# Patient Record
Sex: Female | Born: 1955 | Hispanic: Yes | Marital: Married | Smoking: Never smoker
Health system: Southern US, Community
[De-identification: ages and names within clinical notes are randomized; demographics above are authoritative.]

## PROBLEM LIST (undated history)

## (undated) DIAGNOSIS — R51 Headache: Secondary | ICD-10-CM

## (undated) DIAGNOSIS — E119 Type 2 diabetes mellitus without complications: Secondary | ICD-10-CM

## (undated) DIAGNOSIS — I10 Essential (primary) hypertension: Secondary | ICD-10-CM

## (undated) HISTORY — DX: Type 2 diabetes mellitus without complications: E11.9

## (undated) HISTORY — PX: NO PAST SURGERIES: SHX2092

---

## 2005-03-09 ENCOUNTER — Ambulatory Visit: Payer: Self-pay | Admitting: *Deleted

## 2011-10-29 ENCOUNTER — Emergency Department: Payer: Self-pay | Admitting: Emergency Medicine

## 2011-10-29 LAB — COMPREHENSIVE METABOLIC PANEL
Alkaline Phosphatase: 106 U/L (ref 50–136)
Bilirubin,Total: 0.5 mg/dL (ref 0.2–1.0)
Calcium, Total: 8.4 mg/dL — ABNORMAL LOW (ref 8.5–10.1)
Chloride: 107 mmol/L (ref 98–107)
Co2: 24 mmol/L (ref 21–32)
Creatinine: 0.66 mg/dL (ref 0.60–1.30)
EGFR (Non-African Amer.): >60
SGOT(AST): 23 U/L (ref 15–37)
SGPT (ALT): 31 U/L

## 2011-10-29 LAB — TROPONIN I: Troponin-I: <0.02 ng/mL

## 2011-10-29 LAB — CBC
MCHC: 34.2 g/dL (ref 32.0–36.0)
MCV: 90 fL (ref 80–100)
Platelet: 173 10*3/uL (ref 150–440)
RDW: 13.3 % (ref 11.5–14.5)
WBC: 5.5 10*3/uL (ref 3.6–11.0)

## 2011-10-29 LAB — URINALYSIS, COMPLETE
Nitrite: NEGATIVE
Ph: 6 (ref 4.5–8.0)
Protein: NEGATIVE
Specific Gravity: 1.008 (ref 1.003–1.030)
WBC UR: <1 /HPF (ref 0–5)

## 2012-04-12 ENCOUNTER — Emergency Department (HOSPITAL_COMMUNITY): Payer: Self-pay

## 2012-04-12 ENCOUNTER — Emergency Department (HOSPITAL_COMMUNITY)
Admission: EM | Admit: 2012-04-12 | Discharge: 2012-04-12 | Disposition: A | Discharge status: Home / Self Care | Payer: Self-pay | Attending: Emergency Medicine | Admitting: Emergency Medicine

## 2012-04-12 ENCOUNTER — Encounter (HOSPITAL_COMMUNITY): Payer: Self-pay

## 2012-04-12 DIAGNOSIS — R109 Unspecified abdominal pain: Secondary | ICD-10-CM

## 2012-04-12 DIAGNOSIS — R1013 Epigastric pain: Secondary | ICD-10-CM | POA: Insufficient documentation

## 2012-04-12 DIAGNOSIS — Z8719 Personal history of other diseases of the digestive system: Secondary | ICD-10-CM | POA: Insufficient documentation

## 2012-04-12 LAB — COMPREHENSIVE METABOLIC PANEL
ALT: 19 U/L (ref 0–35)
CO2: 24 mEq/L (ref 19–32)
Calcium: 8.6 mg/dL (ref 8.4–10.5)
Creatinine, Ser: 0.6 mg/dL (ref 0.50–1.10)
GFR calc Af Amer: >90 mL/min (ref >90)
GFR calc non Af Amer: >90 mL/min (ref >90)
Glucose, Bld: 110 mg/dL — ABNORMAL HIGH (ref 70–99)
Total Bilirubin: 0.4 mg/dL (ref 0.3–1.2)

## 2012-04-12 LAB — CBC WITH DIFFERENTIAL/PLATELET
Eosinophils Relative: 4 % (ref 0–5)
HCT: 39.4 % (ref 36.0–46.0)
Hemoglobin: 13.7 g/dL (ref 12.0–15.0)
Lymphocytes Relative: 34 % (ref 12–46)
Lymphs Abs: 2.2 10*3/uL (ref 0.7–4.0)
MCV: 87 fL (ref 78.0–100.0)
Monocytes Absolute: 0.5 10*3/uL (ref 0.1–1.0)
RBC: 4.53 MIL/uL (ref 3.87–5.11)
WBC: 6.5 10*3/uL (ref 4.0–10.5)

## 2012-04-12 MED ORDER — SODIUM CHLORIDE 0.9 % IV BOLUS (SEPSIS)
250.0000 mL | Freq: Once | INTRAVENOUS | Status: AC
  Administered 2012-04-12: 250 mL via INTRAVENOUS

## 2012-04-12 MED ORDER — HYDROMORPHONE HCL PF 1 MG/ML IJ SOLN
1.0000 mg | Freq: Once | INTRAMUSCULAR | Status: AC
  Administered 2012-04-12: 1 mg via INTRAVENOUS
  Filled 2012-04-12: qty 1

## 2012-04-12 MED ORDER — OXYCODONE-ACETAMINOPHEN 5-325 MG PO TABS: 2.0000 | ORAL_TABLET | ORAL | Status: DC | PRN

## 2012-04-12 MED ORDER — SODIUM CHLORIDE 0.9 % IV SOLN: INTRAVENOUS | Status: DC

## 2012-04-12 MED ORDER — ONDANSETRON HCL 4 MG/2ML IJ SOLN
4.0000 mg | Freq: Once | INTRAMUSCULAR | Status: AC
  Administered 2012-04-12: 4 mg via INTRAVENOUS
  Filled 2012-04-12: qty 2

## 2012-04-12 NOTE — ED Notes (Signed)
Report given to Drinda Butts, RN in CDU

## 2012-04-12 NOTE — ED Notes (Signed)
Pt back from US

## 2012-04-12 NOTE — ED Notes (Signed)
Per husband, pt was seen at [facility] a month ago and was dx with gallstones. Is here today because her stomach started hurting her last night. Denies any vomiting or diarrhea, only the pain.

## 2012-04-12 NOTE — ED Notes (Signed)
Dr. Zackowski at the bedside.  

## 2012-04-12 NOTE — ED Notes (Signed)
Pt still in US at this time.

## 2012-04-12 NOTE — ED Provider Notes (Addendum)
History     CSN: [id number]  Arrival date & time 04/12/12  4098   First MD Initiated Contact with Patient 04/12/12 [phone]      Chief Complaint  Patient presents with  . Abdominal Pain    (Consider location/radiation/quality/duration/timing/severity/associated sxs/prior treatment) The history is provided by the patient and the spouse. The history is limited by a language barrier.   Patient is a 56 year old female with onset of epigastric abdominal pain last evening. Patient with a known history of gallstones diagnosed at [facility] month ago. Was advised that gallbladder removed the patient is a 1 have that done. No nausea vomiting or diarrhea. Pain appears to be 8/10 mostly epigastric the patient does state it radiates all over. Is similar to the pain she had a month ago when [facility] diagnosed gallstones. Denies history of any abdominal surgery or past medical history of any significance. Pain is described as sharp and nonradiating.  History reviewed. No pertinent past medical history.  History reviewed. No pertinent past surgical history.  No family history on file.  History  Substance Use Topics  . Smoking status: Never Smoker   . Smokeless tobacco: Not on file  . Alcohol Use: No    OB History    Grav Para Term Preterm Abortions TAB SAB Ect Mult Living                  Review of Systems  Constitutional: Negative for fever.  HENT: Negative for congestion.   Eyes: Negative for redness.  Respiratory: Negative for shortness of breath.   Cardiovascular: Negative for chest pain.  Gastrointestinal: Positive for abdominal pain. Negative for nausea, vomiting and diarrhea.  Genitourinary: Negative for dysuria.  Musculoskeletal: Negative for back pain.  Skin: Negative for rash.  Neurological: Negative for headaches.  Hematological: Does not bruise/bleed easily.    Allergies  Review of patient's allergies indicates no known allergies.  Home Medications   Current  Outpatient Rx  Name  Route  Sig  Dispense  Refill  . ACETAMINOPHEN 500 MG PO TABS   Oral   Take 500 mg by mouth every 6 (six) hours as needed. For pain/fever           BP 160/69  Pulse 60  Temp 98 F (36.7 C) (Oral)  Resp 18  SpO2 97%  Physical Exam  Nursing note and vitals reviewed. Constitutional: She is oriented to person, place, and time. She appears well-developed and well-nourished. No distress.  HENT:  Head: Normocephalic and atraumatic.  Mouth/Throat: Oropharynx is clear and moist.  Eyes: Conjunctivae normal and EOM are normal. Pupils are equal, round, and reactive to light.  Neck: Normal range of motion. Neck supple.  Cardiovascular: Normal rate, regular rhythm and normal heart sounds.   No murmur heard. Pulmonary/Chest: Effort normal and breath sounds normal. No respiratory distress. She has no wheezes. She has no rales.  Abdominal: Soft. Bowel sounds are normal. She exhibits no distension and no mass. There is no tenderness. There is no rebound and no guarding.  Musculoskeletal: She exhibits no edema.  Neurological: She is alert and oriented to person, place, and time. No cranial nerve deficit. She exhibits normal muscle tone. Coordination normal.  Skin: Skin is warm. No rash noted.    ED Course  Procedures (including critical care time)   Labs Reviewed  URINALYSIS, MICROSCOPIC ONLY  CBC WITH DIFFERENTIAL  COMPREHENSIVE METABOLIC PANEL  LIPASE, BLOOD  URINALYSIS, ROUTINE W REFLEX MICROSCOPIC   No results found.  Date: 04/12/2012  Rate: 52  Rhythm: normal sinus rhythm  QRS Axis: normal  Intervals: normal  ST/T Wave abnormalities: nonspecific T wave changes  Conduction Disutrbances:none  Narrative Interpretation:   Old EKG Reviewed: none available    1. Abdominal pain       MDM   Patient will be moved to the CDU this will be a shared visit with the mid-level. Ultrasound is pending if ultrasound is negative patient will probably need CT of  the abdomen with contrast. Further valuate. Patient supposedly has a history of gallstones diagnosed at the [facility] month ago most likely the symptoms are related to that. On examination no significant bowel tenderness suggestive of acute cholecystitis at this time however labs will help determine further. Patient has significant leukocytosis or elevated lipase are elevated bilirubin and things become more concerning typically if the ultrasound shows either gallstones or evidence of acute cholecystitis. If patient's pain does not improve or if his lab abnormalities she will most likely require admission.  Chest x-ray EKG ordered to rule out any evidence of a lower lobe pneumonia or evidence of an acute cardiac event as the cause of the epigastric abdominal pain.  Medical screening examination/treatment/procedure(s) were conducted as a shared visit with non-physician practitioner(s) and myself.  I personally evaluated the patient during the encounter         Shelda Jakes, MD 04/12/12 4098  Shelda Jakes, MD 04/14/12 [phone]

## 2012-04-12 NOTE — ED Notes (Signed)
Pt transported to xray 

## 2012-04-12 NOTE — ED Provider Notes (Signed)
11:04 AM Patient with a hx sig for abdominal pain was placed in CDU by Dr. Deretha Emory. Patient care resumed from Dr. Deretha Emory .  Patient is here for abdominal pain and has received dilaudid and is pending RUQ [place]. Plan per previous provider is to disposition the patient based on [place] results and pain control. On exam: hemodynamically stable, NAD, heart w/ RRR, lungs CTAB, Chest & abd non-tender, no peripheral edema or calf tenderness.   Patient reports pain is controlled. US shows gallstones which patient was aware of. The stones cause intermittent pain. Labs stable, vitals stable.  Patient will be sent home with Surgery follow up and pain medication.    Emilia Beck, PA-C 04/12/12 1231

## 2012-04-14 NOTE — ED Provider Notes (Signed)
Medical screening examination/treatment/procedure(s) were conducted as a shared visit with non-physician practitioner(s) and myself.  I personally evaluated the patient during the encounter   Shelda Jakes, MD 04/14/12 [phone]

## 2012-04-29 ENCOUNTER — Ambulatory Visit (INDEPENDENT_AMBULATORY_CARE_PROVIDER_SITE_OTHER): Payer: Self-pay | Admitting: General Surgery

## 2012-04-29 ENCOUNTER — Encounter (INDEPENDENT_AMBULATORY_CARE_PROVIDER_SITE_OTHER): Payer: Self-pay | Admitting: General Surgery

## 2012-04-29 VITALS — BP 115/75 | HR 66 | Temp 98.6°F | Resp 12 | Ht 60.0 in | Wt 185.4 lb

## 2012-04-29 DIAGNOSIS — K8020 Calculus of gallbladder without cholecystitis without obstruction: Secondary | ICD-10-CM

## 2012-04-29 NOTE — Progress Notes (Signed)
Patient ID: Marie Curtis, female   DOB: March 08, 1956, 57 y.o.   MRN: [id number]  Chief Complaint  Patient presents with  . Abdominal Pain    HPI Marie Curtis is a 57 y.o. female.  The patient is a 57 year old female referred by Dr. Jodelle Gross symptomatic cholelithiasis. Patient states she had pain for approximately a year which started after eating. She states there is no nausea or vomiting. She complains of no fevers chills or diarrhea. HPI  History reviewed. No pertinent past medical history.  History reviewed. No pertinent past surgical history.  History reviewed. No pertinent family history.  Social History History  Substance Use Topics  . Smoking status: Never Smoker   . Smokeless tobacco: Not on file  . Alcohol Use: No    No Known Allergies  Current Outpatient Prescriptions  Medication Sig Dispense Refill  . acetaminophen (TYLENOL) 500 MG tablet Take 500 mg by mouth every 6 (six) hours as needed. For pain/fever      . oxyCODONE-acetaminophen (PERCOCET/ROXICET) 5-325 MG per tablet Take 2 tablets by mouth every 4 (four) hours as needed for pain.  15 tablet  0    Review of Systems Review of Systems  Constitutional: Negative.   HENT: Negative.   Eyes: Negative.   Respiratory: Negative.   Gastrointestinal: Positive for abdominal pain (ruq).  Neurological: Negative.     Blood pressure 115/75, pulse 66, temperature 98.6 F (37 C), temperature source Temporal, resp. rate 12, height 5' (1.524 m), weight 185 lb 6.4 oz (84.097 kg).  Physical Exam Physical Exam  Constitutional: She appears well-developed and well-nourished.  HENT:  Head: Normocephalic and atraumatic.  Eyes: Conjunctivae normal and EOM are normal. Pupils are equal, round, and reactive to light.  Neck: Normal range of motion. Neck supple.  Cardiovascular: Normal rate and normal heart sounds.   Pulmonary/Chest: Effort normal and breath sounds normal.  Abdominal: Soft. Bowel sounds are normal. There is  tenderness (ruq). There is no rebound and no guarding.  Musculoskeletal: Normal range of motion.    Data Reviewed Synovial multiple gallstones within the gallbladder. LFTs within normal limits  Assessment    57 year old female with cholelithiasis    Plan    1. We will Proceed to To the operating room for laparoscopic cholecystectomy with no cholangiogram  2.All risks and benefits were discussed with the patient, to generally include infection, bleeding, damage to surrounding structures, and recurrence. Alternatives were offered and described.  All questions were answered and the patient voiced understanding of the procedure and wishes to proceed at this point.        Marie Curtis., Marie Curtis 04/29/2012, 3:54 PM

## 2012-05-02 ENCOUNTER — Encounter (HOSPITAL_COMMUNITY): Payer: Self-pay | Admitting: Pharmacy Technician

## 2012-05-05 ENCOUNTER — Encounter (HOSPITAL_COMMUNITY)
Admission: RE | Admit: 2012-05-05 | Discharge: 2012-05-05 | Disposition: A | Discharge status: Home / Self Care | Payer: Self-pay | Source: Ambulatory Visit | Attending: General Surgery | Admitting: General Surgery

## 2012-05-05 ENCOUNTER — Encounter (HOSPITAL_COMMUNITY): Payer: Self-pay

## 2012-05-05 HISTORY — DX: Headache: R51

## 2012-05-05 LAB — CBC
Hemoglobin: 14.3 g/dL (ref 12.0–15.0)
MCHC: 35.7 g/dL (ref 30.0–36.0)
RBC: 4.6 MIL/uL (ref 3.87–5.11)

## 2012-05-05 LAB — SURGICAL PCR SCREEN
MRSA, PCR: NEGATIVE
Staphylococcus aureus: NEGATIVE

## 2012-05-05 MED ORDER — CEFAZOLIN SODIUM-DEXTROSE 2-3 GM-% IV SOLR
2.0000 g | INTRAVENOUS | Status: AC
  Administered 2012-05-06: 2 g via INTRAVENOUS
  Filled 2012-05-05: qty 50

## 2012-05-05 NOTE — Pre-Procedure Instructions (Addendum)
Marie Curtis  05/05/2012   Your procedure is scheduled on:  Friday, January 17th.  Report to [facility]  12:45 PM.  Call this number if you have problems the morning of surgery: [phone]   Remember:   Do not eat food or drink liquids after midnight.    Take these medicines the morning of surgery with A SIP OF WATER: Pain medication if needed.   Do not wear jewelry, make-up or nail polish.  Do not wear lotions, powders, or perfumes. You may wear deodorant.  Do not shave 48 hours prior to surgery. Men may shave face and neck.  Do not bring valuables to the hospital.  Contacts, dentures or bridgework may not be worn into surgery.  Leave suitcase in the car. After surgery it may be brought to your room.  For patients admitted to the hospital, checkout time is 11:00 AM the day of  discharge.   Patients discharged the day of surgery will not be allowed to drive  home.  Name and phone number of your driver:--    Special Instructions: Shower with CHG wash (Bactoshield) tonight and again in the am prior to arriving to hospital.   Please read over the following fact sheets that you were given: Pain Booklet, Coughing and Deep Breathing and Surgical Site Infection Prevention

## 2012-05-06 ENCOUNTER — Encounter (HOSPITAL_COMMUNITY): Payer: Self-pay | Admitting: Anesthesiology

## 2012-05-06 ENCOUNTER — Observation Stay (HOSPITAL_COMMUNITY)
Admission: RE | Admit: 2012-05-06 | Discharge: 2012-05-07 | Disposition: A | Discharge status: Home / Self Care | Payer: Self-pay | Source: Ambulatory Visit | Attending: General Surgery | Admitting: General Surgery

## 2012-05-06 ENCOUNTER — Ambulatory Visit (HOSPITAL_COMMUNITY): Payer: Self-pay

## 2012-05-06 ENCOUNTER — Ambulatory Visit (HOSPITAL_COMMUNITY): Payer: Self-pay | Admitting: Anesthesiology

## 2012-05-06 ENCOUNTER — Encounter (HOSPITAL_COMMUNITY): Payer: Self-pay | Admitting: Surgery

## 2012-05-06 ENCOUNTER — Encounter (HOSPITAL_COMMUNITY)
Admission: RE | Disposition: A | Discharge status: Home / Self Care | Payer: Self-pay | Source: Ambulatory Visit | Attending: General Surgery

## 2012-05-06 DIAGNOSIS — K8010 Calculus of gallbladder with chronic cholecystitis without obstruction: Secondary | ICD-10-CM

## 2012-05-06 DIAGNOSIS — K8020 Calculus of gallbladder without cholecystitis without obstruction: Principal | ICD-10-CM | POA: Insufficient documentation

## 2012-05-06 DIAGNOSIS — Z01812 Encounter for preprocedural laboratory examination: Secondary | ICD-10-CM | POA: Insufficient documentation

## 2012-05-06 DIAGNOSIS — Z9049 Acquired absence of other specified parts of digestive tract: Secondary | ICD-10-CM

## 2012-05-06 HISTORY — PX: CHOLECYSTECTOMY: SHX55

## 2012-05-06 SURGERY — LAPAROSCOPIC CHOLECYSTECTOMY
Anesthesia: General | Site: Abdomen | Wound class: Clean Contaminated

## 2012-05-06 MED ORDER — SODIUM CHLORIDE 0.9 % IJ SOLN: 3.0000 mL | Freq: Two times a day (BID) | INTRAMUSCULAR | Status: DC

## 2012-05-06 MED ORDER — BUPIVACAINE HCL (PF) 0.25 % IJ SOLN
INTRAMUSCULAR | Status: AC
  Filled 2012-05-06: qty 30

## 2012-05-06 MED ORDER — OXYCODONE HCL 5 MG PO TABS
5.0000 mg | ORAL_TABLET | ORAL | Status: DC | PRN
  Administered 2012-05-07 (×2): 10 mg via ORAL
  Filled 2012-05-06 (×2): qty 2

## 2012-05-06 MED ORDER — MIDAZOLAM HCL 5 MG/5ML IJ SOLN
INTRAMUSCULAR | Status: DC | PRN
  Administered 2012-05-06: 1 mg via INTRAVENOUS

## 2012-05-06 MED ORDER — OXYCODONE HCL 5 MG PO TABS: 5.0000 mg | ORAL_TABLET | ORAL | Status: DC | PRN

## 2012-05-06 MED ORDER — PROPOFOL 10 MG/ML IV BOLUS
INTRAVENOUS | Status: DC | PRN
  Administered 2012-05-06: 200 mg via INTRAVENOUS
  Administered 2012-05-06: 50 mg via INTRAVENOUS

## 2012-05-06 MED ORDER — ONDANSETRON HCL 4 MG/2ML IJ SOLN
INTRAMUSCULAR | Status: AC
  Filled 2012-05-06: qty 2

## 2012-05-06 MED ORDER — HYDROMORPHONE HCL PF 1 MG/ML IJ SOLN
INTRAMUSCULAR | Status: AC
  Filled 2012-05-06: qty 1

## 2012-05-06 MED ORDER — ACETAMINOPHEN 650 MG RE SUPP
650.0000 mg | RECTAL | Status: DC | PRN
  Filled 2012-05-06: qty 1

## 2012-05-06 MED ORDER — ONDANSETRON HCL 4 MG/2ML IJ SOLN
4.0000 mg | Freq: Four times a day (QID) | INTRAMUSCULAR | Status: DC | PRN
  Administered 2012-05-07 (×2): 4 mg via INTRAVENOUS
  Filled 2012-05-06 (×3): qty 2

## 2012-05-06 MED ORDER — SODIUM CHLORIDE 0.9 % IJ SOLN: 3.0000 mL | INTRAMUSCULAR | Status: DC | PRN

## 2012-05-06 MED ORDER — HYDROMORPHONE HCL PF 1 MG/ML IJ SOLN: 0.2500 mg | INTRAMUSCULAR | Status: DC | PRN

## 2012-05-06 MED ORDER — HYDROMORPHONE HCL PF 1 MG/ML IJ SOLN: 1.0000 mg | INTRAMUSCULAR | Status: DC | PRN

## 2012-05-06 MED ORDER — ONDANSETRON HCL 4 MG/2ML IJ SOLN
INTRAMUSCULAR | Status: DC | PRN
  Administered 2012-05-06: 4 mg via INTRAVENOUS

## 2012-05-06 MED ORDER — ARTIFICIAL TEARS OP OINT
TOPICAL_OINTMENT | OPHTHALMIC | Status: DC | PRN
  Administered 2012-05-06: 1 via OPHTHALMIC

## 2012-05-06 MED ORDER — CHLORHEXIDINE GLUCONATE 4 % EX LIQD: 1.0000 "application " | Freq: Once | CUTANEOUS | Status: DC

## 2012-05-06 MED ORDER — LIDOCAINE HCL (CARDIAC) 20 MG/ML IV SOLN
INTRAVENOUS | Status: DC | PRN
  Administered 2012-05-06: 100 mg via INTRAVENOUS

## 2012-05-06 MED ORDER — ROCURONIUM BROMIDE 100 MG/10ML IV SOLN
INTRAVENOUS | Status: DC | PRN
  Administered 2012-05-06 (×2): 10 mg via INTRAVENOUS

## 2012-05-06 MED ORDER — HYDROMORPHONE HCL PF 1 MG/ML IJ SOLN
0.2500 mg | INTRAMUSCULAR | Status: DC | PRN
  Administered 2012-05-06 (×4): 0.5 mg via INTRAVENOUS

## 2012-05-06 MED ORDER — SODIUM CHLORIDE 0.9 % IR SOLN
Status: DC | PRN
  Administered 2012-05-06: 1000 mL

## 2012-05-06 MED ORDER — BUPIVACAINE HCL 0.25 % IJ SOLN
INTRAMUSCULAR | Status: DC | PRN
  Administered 2012-05-06: 9 mL

## 2012-05-06 MED ORDER — ACETAMINOPHEN 325 MG PO TABS
650.0000 mg | ORAL_TABLET | ORAL | Status: DC | PRN
  Filled 2012-05-06: qty 2

## 2012-05-06 MED ORDER — LACTATED RINGERS IV SOLN
INTRAVENOUS | Status: DC | PRN
  Administered 2012-05-06 (×2): via INTRAVENOUS

## 2012-05-06 MED ORDER — ONDANSETRON HCL 4 MG/2ML IJ SOLN: 4.0000 mg | Freq: Four times a day (QID) | INTRAMUSCULAR | Status: DC | PRN

## 2012-05-06 MED ORDER — FENTANYL CITRATE 0.05 MG/ML IJ SOLN
INTRAMUSCULAR | Status: DC | PRN
  Administered 2012-05-06: 50 ug via INTRAVENOUS
  Administered 2012-05-06: 25 ug via INTRAVENOUS
  Administered 2012-05-06: 50 ug via INTRAVENOUS

## 2012-05-06 MED ORDER — NEOSTIGMINE METHYLSULFATE 1 MG/ML IJ SOLN
INTRAMUSCULAR | Status: DC | PRN
  Administered 2012-05-06: 5 mg via INTRAVENOUS

## 2012-05-06 MED ORDER — OXYCODONE-ACETAMINOPHEN 5-325 MG PO TABS: 1.0000 | ORAL_TABLET | ORAL | Status: DC | PRN

## 2012-05-06 MED ORDER — MORPHINE SULFATE 2 MG/ML IJ SOLN
2.0000 mg | INTRAMUSCULAR | Status: DC | PRN
  Administered 2012-05-06 – 2012-05-07 (×3): 2 mg via INTRAVENOUS
  Filled 2012-05-06 (×3): qty 1

## 2012-05-06 MED ORDER — GLYCOPYRROLATE 0.2 MG/ML IJ SOLN
INTRAMUSCULAR | Status: DC | PRN
  Administered 2012-05-06: .8 mg via INTRAVENOUS

## 2012-05-06 MED ORDER — SODIUM CHLORIDE 0.9 % IV SOLN
250.0000 mL | INTRAVENOUS | Status: DC | PRN
  Administered 2012-05-06: 250 mL via INTRAVENOUS

## 2012-05-06 SURGICAL SUPPLY — 43 items
APPLIER CLIP 5 13 M/L LIGAMAX5 (MISCELLANEOUS) ×2
BENZOIN TINCTURE PRP APPL 2/3 (GAUZE/BANDAGES/DRESSINGS) ×2 IMPLANT
CANISTER SUCTION 2500CC (MISCELLANEOUS) ×2 IMPLANT
CHLORAPREP W/TINT 26ML (MISCELLANEOUS) ×2 IMPLANT
CLIP APPLIE 5 13 M/L LIGAMAX5 (MISCELLANEOUS) ×1 IMPLANT
CLOTH BEACON ORANGE TIMEOUT ST (SAFETY) ×2 IMPLANT
COVER MAYO STAND STRL (DRAPES) IMPLANT
COVER SURGICAL LIGHT HANDLE (MISCELLANEOUS) ×2 IMPLANT
COVER TRANSDUCER ULTRASND (DRAPES) ×2 IMPLANT
DEVICE TROCAR PUNCTURE CLOSURE (ENDOMECHANICALS) ×2 IMPLANT
DRAPE C-ARM 42X72 X-RAY (DRAPES) IMPLANT
DRAPE UTILITY 15X26 W/TAPE STR (DRAPE) ×4 IMPLANT
ELECT REM PT RETURN 9FT ADLT (ELECTROSURGICAL) ×2
ELECTRODE REM PT RTRN 9FT ADLT (ELECTROSURGICAL) ×1 IMPLANT
GAUZE SPONGE 2X2 8PLY STRL LF (GAUZE/BANDAGES/DRESSINGS) ×1 IMPLANT
GLOVE BIO SURGEON STRL SZ7.5 (GLOVE) ×2 IMPLANT
GLOVE BIOGEL PI IND STRL 7.0 (GLOVE) ×1 IMPLANT
GLOVE BIOGEL PI INDICATOR 7.0 (GLOVE) ×1
GLOVE SURG SS PI 7.5 STRL IVOR (GLOVE) ×6 IMPLANT
GOWN STRL NON-REIN LRG LVL3 (GOWN DISPOSABLE) ×6 IMPLANT
GOWN STRL REIN XL XLG (GOWN DISPOSABLE) ×2 IMPLANT
IV CATH 14GX2 1/4 (CATHETERS) IMPLANT
KIT BASIN OR (CUSTOM PROCEDURE TRAY) ×2 IMPLANT
KIT ROOM TURNOVER OR (KITS) ×2 IMPLANT
NEEDLE INSUFFLATION 14GA 120MM (NEEDLE) ×2 IMPLANT
NS IRRIG 1000ML POUR BTL (IV SOLUTION) ×2 IMPLANT
PAD ARMBOARD 7.5X6 YLW CONV (MISCELLANEOUS) ×4 IMPLANT
POUCH SPECIMEN RETRIEVAL 10MM (ENDOMECHANICALS) IMPLANT
SCISSORS LAP 5X35 DISP (ENDOMECHANICALS) ×2 IMPLANT
SET CHOLANGIOGRAPHY FRANKLIN (SET/KITS/TRAYS/PACK) IMPLANT
SET IRRIG TUBING LAPAROSCOPIC (IRRIGATION / IRRIGATOR) ×2 IMPLANT
SLEEVE ENDOPATH XCEL 5M (ENDOMECHANICALS) ×4 IMPLANT
SPECIMEN JAR SMALL (MISCELLANEOUS) ×2 IMPLANT
SPONGE GAUZE 2X2 STER 10/PKG (GAUZE/BANDAGES/DRESSINGS) ×1
STRIP CLOSURE SKIN 1/2X4 (GAUZE/BANDAGES/DRESSINGS) ×2 IMPLANT
SUT MNCRL AB 3-0 PS2 18 (SUTURE) ×2 IMPLANT
SUT VICRYL 0 UR6 27IN ABS (SUTURE) ×2 IMPLANT
TAPE CLOTH SURG 4X10 WHT LF (GAUZE/BANDAGES/DRESSINGS) ×2 IMPLANT
TOWEL OR 17X24 6PK STRL BLUE (TOWEL DISPOSABLE) ×2 IMPLANT
TOWEL OR 17X26 10 PK STRL BLUE (TOWEL DISPOSABLE) ×2 IMPLANT
TRAY LAPAROSCOPIC (CUSTOM PROCEDURE TRAY) ×2 IMPLANT
TROCAR XCEL NON-BLD 11X100MML (ENDOMECHANICALS) ×2 IMPLANT
TROCAR XCEL NON-BLD 5MMX100MML (ENDOMECHANICALS) ×2 IMPLANT

## 2012-05-06 NOTE — Op Note (Signed)
Pre Operative Diagnosis: symptomatic cholelithiasis  Post Operative Diagnosis: same  Surgeon: Dr. Axel Filler   Procedure:laparoscopic cholecystectomy  Assistant: none  Anesthesia: Gen. Endotracheal anesthesia   EBL: 10 cc  Complications:  Counts: reported as correct x 2   Findings: The patient had minimally inflamed gallbladder  Indications for procedure: the patient is a 57 year old female who had previously presented to the ER for right upper quadrant pain. Patient went ultrasound revealed multiple gallstones within the gallbladder. Patient is taken back for elective cholecystectomy.  Details of the procedure:  The patient was taken to the operating and placed in the supine position with bilateral SCDs in place. A time out was called and all facts were verified. A pneumoperitoneum was obtained via A Veress needle technique to a pressure of 14mm of mercury. A 5mm trochar was then placed in the right upper quadrant under visualization, and there were no injuries to any abdominal organs. A 11 mm port was then placed in the umbilical region after infiltrating with local anesthesia under direct visualization. A second and third epigastric port and right lower quadrant port placement under direct visualization, respectively. The gallbladder was identified and retracted, the peritoneum was then sharply dissected from the gallbladder and this dissection was carried down to Calot's triangle. The gallbladder was identified and stripped away circumferentially and seen going into the gallbladder 360. 2 clips were placed proximally one distally and the cystic duct transected. The cystic artery was identified and 2 clips placed proximally and one distally and transected.  We then proceeded to remove the gallbladder off the hepatic fossa with Bovie cautery.  A latex retrieval bag was then placed in the abdomen and gallbladder placed in the bag. The hepatic fossa was then reexamined and hemostasis was  achieved with Bovie cautery and was excellent at the end of the case. The subhepatic fossa and perihepatic fossa was then irrigated until the effluent was clear. The 11 mm trocar fascia was reapproximated with the Endo Close #1 Vicryl x 2.  The pneumoperitoneum was evacuated and all trochars removed under direct visulalization.  The skin was then closed with 4-0 Monocryl and the skin dressed with Steri-Strips, gauze, and tape.  The patient was awaken from general anesthesia and taken to the recovery room in stable condition.

## 2012-05-06 NOTE — Anesthesia Procedure Notes (Addendum)
Performed by: Cathie Olden B   Procedure Name: Intubation Date/Time: 05/06/2012 2:20 PM Performed by: Sherie Don Pre-anesthesia Checklist: Patient identified, Emergency Drugs available, Suction available, Patient being monitored and Timeout performed Patient Re-evaluated:Patient Re-evaluated prior to inductionOxygen Delivery Method: Circle system utilized Preoxygenation: Pre-oxygenation with 100% oxygen Intubation Type: IV induction and Rapid sequence Laryngoscope Size: Mac and 3 Grade View: Grade I Tube type: Oral Tube size: 7.5 mm Number of attempts: 1 Airway Equipment and Method: Stylet Placement Confirmation: ETT inserted through vocal cords under direct vision,  positive ETCO2 and breath sounds checked- equal and bilateral Secured at: 22 cm Tube secured with: Tape Dental Injury: Teeth and Oropharynx as per pre-operative assessment

## 2012-05-06 NOTE — Anesthesia Postprocedure Evaluation (Signed)
Anesthesia Post Note  Patient: Marie Curtis  Procedure(s) Performed: Procedure(s) (LRB): LAPAROSCOPIC CHOLECYSTECTOMY (N/A)  Anesthesia type: General  Patient location: PACU  Post pain: Pain level controlled and Adequate analgesia  Post assessment: Post-op Vital signs reviewed, Patient's Cardiovascular Status Stable, Respiratory Function Stable, Patent Airway and Pain level controlled  Last Vitals:  Filed Vitals:   05/06/12 1530  BP: 138/62  Pulse: 62  Temp: 36.1 C  Resp: 23    Post vital signs: Reviewed and stable  Level of consciousness: awake, alert  and oriented  Complications: No apparent anesthesia complications

## 2012-05-06 NOTE — Interval H&P Note (Signed)
History and Physical Interval Note:  05/06/2012 10:14 AM  Marie Curtis  has presented today for surgery, with the diagnosis of gallstones  The various methods of treatment have been discussed with the patient and family. After consideration of risks, benefits and other options for treatment, the patient has consented to  Procedure(s) (LRB) with comments: LAPAROSCOPIC CHOLECYSTECTOMY (N/A) as a surgical intervention .  The patient's history has been reviewed, patient examined, no change in status, stable for surgery.  I have reviewed the patient's chart and labs.  Questions were answered to the patient's satisfaction.     Marigene Ehlers., Jed Limerick

## 2012-05-06 NOTE — Preoperative (Signed)
Beta Blockers   Reason not to administer Beta Blockers:Not Applicable 

## 2012-05-06 NOTE — Anesthesia Preprocedure Evaluation (Addendum)
Anesthesia Evaluation  Patient identified by MRN, date of birth, ID band Patient awake    Airway Mallampati: II TM Distance: >3 FB Neck ROM: Full  Mouth opening: Limited Mouth Opening  Dental  (+) Dental Advisory Given Small chip present on front tooth:   Pulmonary  breath sounds clear to auscultation        Cardiovascular negative cardio ROS  Rhythm:Regular Rate:Normal     Neuro/Psych    GI/Hepatic Neg liver ROS, History of abdominal pain and gallstones.   Endo/Other  negative endocrine ROS  Renal/GU negative Renal ROS     Musculoskeletal   Abdominal   Peds  Hematology   Anesthesia Other Findings   Reproductive/Obstetrics                         Anesthesia Physical Anesthesia Plan  ASA: II  Anesthesia Plan: General   Post-op Pain Management:    Induction: Intravenous  Airway Management Planned: Oral ETT  Additional Equipment:   Intra-op Plan:   Post-operative Plan:   Informed Consent: I have reviewed the patients History and Physical, chart, labs and discussed the procedure including the risks, benefits and alternatives for the proposed anesthesia with the patient or authorized representative who has indicated his/her understanding and acceptance.   Dental advisory given  Plan Discussed with: Anesthesiologist, Surgeon and CRNA  Anesthesia Plan Comments:         Anesthesia Quick Evaluation

## 2012-05-06 NOTE — Transfer of Care (Signed)
Immediate Anesthesia Transfer of Care Note  Patient: Marie Curtis  Procedure(s) Performed: Procedure(s) (LRB) with comments: LAPAROSCOPIC CHOLECYSTECTOMY (N/A)  Patient Location: PACU  Anesthesia Type:General  Level of Consciousness: sedated  Airway & Oxygen Therapy: Patient Spontanous Breathing and Patient connected to face mask oxygen  Post-op Assessment: Report given to PACU RN, Post -op Vital signs reviewed and stable and Patient moving all extremities  Post vital signs: Reviewed and stable  Complications: No apparent anesthesia complications

## 2012-05-06 NOTE — OR Nursing (Signed)
Pt has been very sleepy. Unable to wean pt off O2. Lungs clear. VSS. Dr. Derrell Lolling aware. CXR ordered. Family notified by interpreter that pt will be staying overnight.

## 2012-05-06 NOTE — H&P (View-Only) (Signed)
Patient ID: Marie Curtis, female   DOB: 11/26/1955, 56 y.o.   MRN: [id number]  Chief Complaint  Patient presents with  . Abdominal Pain    HPI Marie Curtis is a 56 y.o. female.  The patient is a 56-year-old female referred by Dr. Zakowski symptomatic cholelithiasis. Patient states she had pain for approximately a year which started after eating. She states there is no nausea or vomiting. She complains of no fevers chills or diarrhea. HPI  History reviewed. No pertinent past medical history.  History reviewed. No pertinent past surgical history.  History reviewed. No pertinent family history.  Social History History  Substance Use Topics  . Smoking status: Never Smoker   . Smokeless tobacco: Not on file  . Alcohol Use: No    No Known Allergies  Current Outpatient Prescriptions  Medication Sig Dispense Refill  . acetaminophen (TYLENOL) 500 MG tablet Take 500 mg by mouth every 6 (six) hours as needed. For pain/fever      . oxyCODONE-acetaminophen (PERCOCET/ROXICET) 5-325 MG per tablet Take 2 tablets by mouth every 4 (four) hours as needed for pain.  15 tablet  0    Review of Systems Review of Systems  Constitutional: Negative.   HENT: Negative.   Eyes: Negative.   Respiratory: Negative.   Gastrointestinal: Positive for abdominal pain (ruq).  Neurological: Negative.     Blood pressure 115/75, pulse 66, temperature 98.6 F (37 C), temperature source Temporal, resp. rate 12, height 5' (1.524 m), weight 185 lb 6.4 oz (84.097 kg).  Physical Exam Physical Exam  Constitutional: She appears well-developed and well-nourished.  HENT:  Head: Normocephalic and atraumatic.  Eyes: Conjunctivae normal and EOM are normal. Pupils are equal, round, and reactive to light.  Neck: Normal range of motion. Neck supple.  Cardiovascular: Normal rate and normal heart sounds.   Pulmonary/Chest: Effort normal and breath sounds normal.  Abdominal: Soft. Bowel sounds are normal. There is  tenderness (ruq). There is no rebound and no guarding.  Musculoskeletal: Normal range of motion.    Data Reviewed Synovial multiple gallstones within the gallbladder. LFTs within normal limits  Assessment    56-year-old female with cholelithiasis    Plan    1. We will Proceed to To the operating room for laparoscopic cholecystectomy with no cholangiogram  2.All risks and benefits were discussed with the patient, to generally include infection, bleeding, damage to surrounding structures, and recurrence. Alternatives were offered and described.  All questions were answered and the patient voiced understanding of the procedure and wishes to proceed at this point.        Marie Piazza Jr., Marie Curtis 04/29/2012, 3:54 PM    

## 2012-05-07 NOTE — Progress Notes (Signed)
1 Day Post-Op  Subjective: PT doing well this AM.  Pt with minimal pain this AM.  On O2 overnight with no desats.  Objective: Vital signs in last 24 hours: Temp:  [97 F (36.1 C)-98.2 F (36.8 C)] 97.5 F (36.4 C) (01/18 0235) Pulse Rate:  [52-88] 65  (01/18 0235) Resp:  [13-25] 19  (01/18 0235) BP: (102-150)/(51-86) 117/60 mmHg (01/18 0235) SpO2:  [92 %-99 %] 96 % (01/18 0235) Weight:  [185 lb 6.4 oz (84.097 kg)] 185 lb 6.4 oz (84.097 kg) (01/17 1857) Last BM Date: 05/06/12  Intake/Output from previous day: 01/17 0701 - 01/18 0700 In: 1021.7 [I.V.:1021.7] Out: 400 [Urine:300; Emesis/NG output:100] Intake/Output this shift: Total I/O In: 21.7 [I.V.:21.7] Out: 400 [Urine:300; Emesis/NG output:100]  General appearance: alert and cooperative GI: soft, non-tender; bowel sounds normal; no masses,  no organomegaly  Lab Results:   Basename 05/05/12 1540  WBC 8.3  HGB 14.3  HCT 40.1  PLT 183   BMET No results found for this basename: NA:2,K:2,CL:2,CO2:2,GLUCOSE:2,BUN:2,CREATININE:2,CALCIUM:2 in the last 72 hours PT/INR No results found for this basename: LABPROT:2,INR:2 in the last 72 hours ABG No results found for this basename: PHART:2,PCO2:2,PO2:2,HCO3:2 in the last 72 hours  Studies/Results: Dg Chest Port 1 View  05/06/2012  *RADIOLOGY REPORT*  Clinical Data: 57 year old female with hypoxia, status post cholecystectomy.  PORTABLE CHEST - 1 VIEW  Comparison: 04/12/2012 radiograph  Findings: The cardiomediastinal silhouette is unremarkable. Mild pulmonary vascular congestion is present. Bibasilar and right apical atelectasis are noted. There is no evidence of pleural effusion or pneumothorax.  IMPRESSION: Mild p pulmonary vascular congestion with bibasilar and right apical atelectasis.   Original Report Authenticated By: Harmon Pier, M.D.     Anti-infectives: Anti-infectives     Start     Dose/Rate Route Frequency Ordered Stop   05/06/12 0600   ceFAZolin (ANCEF) IVPB 2  g/50 mL premix        2 g 100 mL/hr over 30 Minutes Intravenous On call to O.R. 05/05/12 1449 05/06/12 1422          Assessment/Plan: s/p Procedure(s) (LRB) with comments: LAPAROSCOPIC CHOLECYSTECTOMY (N/A)  Pt doing well.  No problems with O2 sats.  Pt with nausea under control.  Will plan DC today if off O2 with no problems. PT to f/u x 2weeks.    LOS: 1 day    Marigene Ehlers., [facility] 05/07/2012

## 2012-05-07 NOTE — Progress Notes (Signed)
Pt discharged to home accomp by family and wheeled out in wheelchair.

## 2012-05-07 NOTE — Discharge Summary (Signed)
Physician Discharge Summary  Patient ID: Marie Curtis MRN: [id number] DOB/AGE: Jun 01, 1955 57 y.o.  Admit date: 05/06/2012 Discharge date: 05/07/2012  Admission Diagnoses: s/p cholecystectomy  Discharge Diagnoses:  Active Problems:  * No active hospital problems. *    Discharged Condition: good  Hospital Course: Ptdoing well.  O2 sats and pain controlled postop. No complaints. Tol PO  Consults: None  Significant Diagnostic Studies:   Treatments: surgery: sP Lap chole  Discharge Exam: Blood pressure 117/60, pulse 65, temperature 97.5 F (36.4 C), temperature source Oral, resp. rate 19, height 5' (1.524 m), weight 185 lb 6.4 oz (84.097 kg), SpO2 96.00%. General appearance: alert and cooperative GI: soft, non-tender; bowel sounds normal; no masses,  no organomegaly  Disposition: 01-Home or Self Care  Discharge Orders    Future Appointments: Provider: Department: Dept Phone: Center:   05/24/2012 11:10 AM Axel Filler, MD [facility] [phone] None     Future Orders Please Complete By Expires   Pulse oximetry, continuous   05/07/12   Discharge patient          Medication List     As of 05/07/2012  6:08 AM    TAKE these medications         acetaminophen 500 MG tablet   Commonly known as: TYLENOL   Take 500 mg by mouth every 6 (six) hours as needed. For pain/fever      oxyCODONE-acetaminophen 5-325 MG per tablet   Commonly known as: PERCOCET/ROXICET   Take 2 tablets by mouth every 4 (four) hours as needed for pain.      oxyCODONE-acetaminophen 5-325 MG per tablet   Commonly known as: PERCOCET/ROXICET   Take 1 tablet by mouth every 4 (four) hours as needed for pain.           Follow-up Information    Follow up with Lajean Saver, MD. Schedule an appointment as soon as possible for a visit in 2 weeks.   Contact information:   [address] [place] [address] [phone]          Signed: Marigene Ehlers.,  [facility] 05/07/2012, 6:08 AM

## 2012-05-07 NOTE — Progress Notes (Signed)
Pt ready to go home.  Pt has ambulated in hall and has good family support.  Rx given and explained.  All discharge instructions given and reviewed and copy given, FU appt given w/ [facility] for 05/24/12.  No questions verbalized about home self care.

## 2012-05-09 ENCOUNTER — Encounter (HOSPITAL_COMMUNITY): Payer: Self-pay | Admitting: General Surgery

## 2012-05-24 ENCOUNTER — Encounter (INDEPENDENT_AMBULATORY_CARE_PROVIDER_SITE_OTHER): Payer: Self-pay | Admitting: General Surgery

## 2012-05-24 ENCOUNTER — Ambulatory Visit (INDEPENDENT_AMBULATORY_CARE_PROVIDER_SITE_OTHER): Payer: Self-pay | Admitting: General Surgery

## 2012-05-24 VITALS — BP 132/76 | HR 74 | Temp 98.1°F | Resp 18 | Ht 60.0 in | Wt 180.5 lb

## 2012-05-24 DIAGNOSIS — Z9889 Other specified postprocedural states: Secondary | ICD-10-CM

## 2012-05-24 NOTE — Progress Notes (Signed)
Patient ID: Marie Curtis, female   DOB: Nov 02, 1955, 57 y.o.   MRN: [id number] The patient is a 57 year old female status post laparoscopic cholecystectomy.  The patient has been doing well postoperatively. She no longer has pain after eating. She continues with normal bowel function normal diet.  Pathology revealed chronic cholecystitis and cholelithiasis this was discussed with patient.  Assessment and plan: 57 year old female status post laparoscopic cholecystectomy The patient follow up when necessary We discussed with her weight restrictions a 10-15 pounds for another month.

## 2013-07-27 IMAGING — CR DG CHEST 1V PORT
1 series · 1 of 1 positions shown · non-contrast
Comparison: 04/12/2012 radiograph

CLINICAL DATA: 56-year-old female with hypoxia, status post
cholecystectomy.

PORTABLE CHEST - 1 VIEW

[AP]
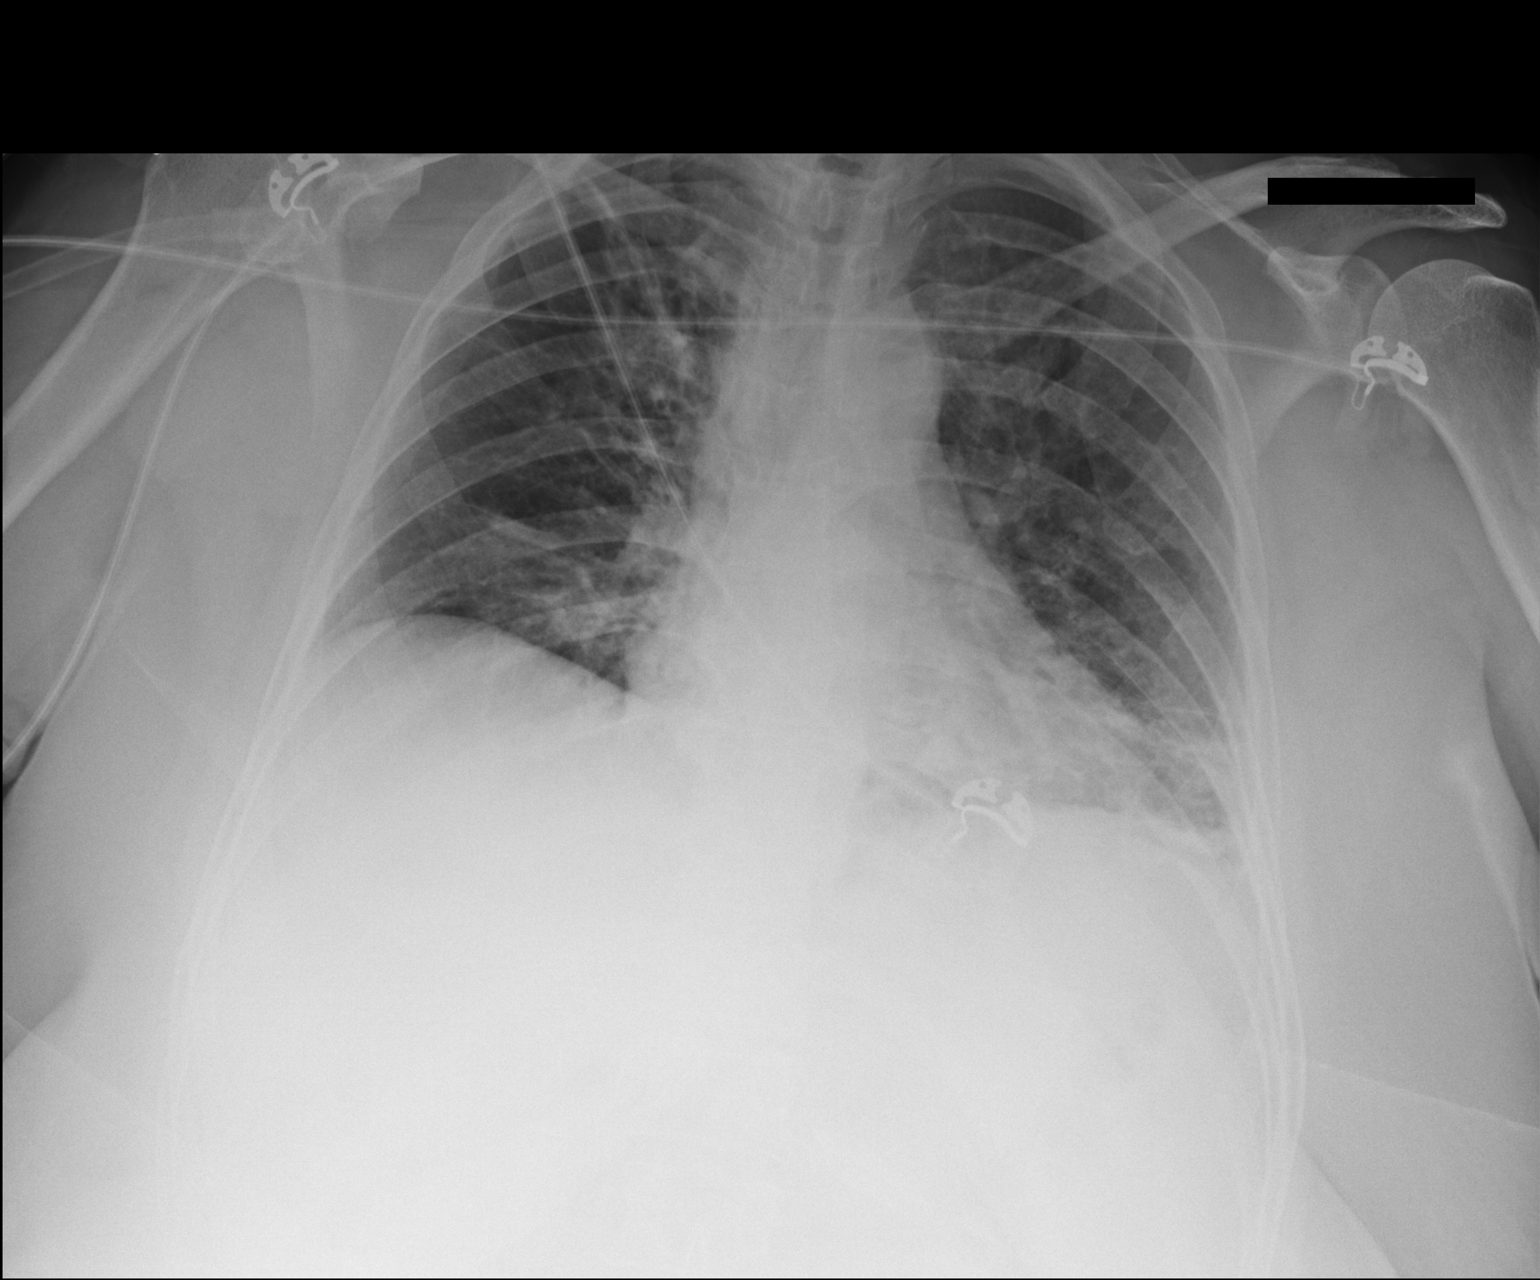

[1 of 1 positions shown; findings below may reference images not displayed]

FINDINGS: The cardiomediastinal silhouette is unremarkable.
Mild pulmonary vascular congestion is present.
Bibasilar and right apical atelectasis are noted.
There is no evidence of pleural effusion or pneumothorax.
IMPRESSION: Mild p pulmonary vascular congestion with bibasilar and right
apical atelectasis.

## 2018-10-28 ENCOUNTER — Emergency Department (HOSPITAL_COMMUNITY): Payer: HRSA Program

## 2018-10-28 ENCOUNTER — Other Ambulatory Visit: Payer: Self-pay

## 2018-10-28 ENCOUNTER — Encounter (HOSPITAL_COMMUNITY): Payer: Self-pay | Admitting: Emergency Medicine

## 2018-10-28 ENCOUNTER — Emergency Department (HOSPITAL_COMMUNITY)
Admission: EM | Admit: 2018-10-28 | Discharge: 2018-10-28 | Disposition: A | Discharge status: Home / Self Care | Payer: HRSA Program | Attending: Emergency Medicine | Admitting: Emergency Medicine

## 2018-10-28 DIAGNOSIS — B349 Viral infection, unspecified: Secondary | ICD-10-CM

## 2018-10-28 DIAGNOSIS — R059 Cough, unspecified: Secondary | ICD-10-CM

## 2018-10-28 DIAGNOSIS — R5383 Other fatigue: Secondary | ICD-10-CM | POA: Diagnosis not present

## 2018-10-28 DIAGNOSIS — U071 COVID-19: Secondary | ICD-10-CM | POA: Insufficient documentation

## 2018-10-28 DIAGNOSIS — I10 Essential (primary) hypertension: Secondary | ICD-10-CM | POA: Diagnosis not present

## 2018-10-28 DIAGNOSIS — M7918 Myalgia, other site: Secondary | ICD-10-CM | POA: Insufficient documentation

## 2018-10-28 DIAGNOSIS — R05 Cough: Secondary | ICD-10-CM | POA: Diagnosis present

## 2018-10-28 HISTORY — DX: Essential (primary) hypertension: I10

## 2018-10-28 NOTE — ED Provider Notes (Addendum)
[facility] Provider Note   CSN: [id number] Arrival date & time: 10/28/18  1716    History   Chief Complaint Chief Complaint  Patient presents with  . Cough    HPI Marie Curtis is a 63 y.o. female past medical history of hypertension who presents for evaluation of fatigue and cough that began yesterday.  Patient states that she started having some cough that was not productive of any phlegm or hemoptysis.  She states that she feels like she has to cough things up but is not coming back.  She reports some associated chest soreness that only occurs with coughing.  No other chest pain at rest.  No trouble breathing.  She has not noted any fevers at home but does state that she has had some generalized body aches and felt tired.  Patient states she is concerned because she has been around her grandchildren and is concerned she has COVID.  She has not had any travel and denies any known COVID-19 exposure.  Patient denies any fever, chest pain, abdominal pain, nausea/vomiting, diarrhea, difficulty breathing.  She has no history of asthma and does not smoke.     The history is provided by a relative. No language interpreter was used Product manager language interpreter but husband declined and states that he would interpret.).    Past Medical History:  Diagnosis Date  . Headache(784.0)   . Hypertension     There are no active problems to display for this patient.   Past Surgical History:  Procedure Laterality Date  . CHOLECYSTECTOMY  05/06/2012   Procedure: LAPAROSCOPIC CHOLECYSTECTOMY;  Surgeon: Ralene Ok, MD;  Location: [facility];  Service: General;  Laterality: N/A;  . NO PAST SURGERIES       OB History   No obstetric history on file.      Home Medications    Prior to Admission medications   Medication Sig Start Date End Date Taking? Authorizing Provider  acetaminophen (TYLENOL) 500 MG tablet Take 500 mg by mouth every 6 (six)  hours as needed. For pain/fever    [provider]  oxyCODONE-acetaminophen (PERCOCET/ROXICET) 5-325 MG per tablet Take 2 tablets by mouth every 4 (four) hours as needed for pain. 04/12/12   Alvina Chou, PA-C  oxyCODONE-acetaminophen (ROXICET) 5-325 MG per tablet Take 1 tablet by mouth every 4 (four) hours as needed for pain. 05/06/12   Ralene Ok, MD    Family History No family history on file.  Social History Social History   Tobacco Use  . Smoking status: Never Smoker  . Smokeless tobacco: Never Used  Substance Use Topics  . Alcohol use: No  . Drug use: No     Allergies   Patient has no known allergies.   Review of Systems Review of Systems  Constitutional: Positive for fatigue. Negative for fever.  Respiratory: Positive for cough. Negative for shortness of breath.   Cardiovascular: Negative for chest pain.  Gastrointestinal: Negative for abdominal pain, nausea and vomiting.  Genitourinary: Negative for dysuria and hematuria.  Musculoskeletal: Positive for myalgias.  Neurological: Negative for headaches.  All other systems reviewed and are negative.    Physical Exam Updated Vital Signs BP 118/61 (BP Location: Right Arm)   Pulse 65   Temp 98.8 F (37.1 C) (Oral)   Resp 16   SpO2 98%   Physical Exam Vitals signs and nursing note reviewed.  Constitutional:      Appearance: Normal appearance. She is well-developed.  HENT:  Head: Normocephalic and atraumatic.  Eyes:     General: Lids are normal.     Conjunctiva/sclera: Conjunctivae normal.     Pupils: Pupils are equal, round, and reactive to light.  Neck:     Musculoskeletal: Full passive range of motion without pain.  Cardiovascular:     Rate and Rhythm: Normal rate and regular rhythm.     Pulses: Normal pulses.     Heart sounds: Normal heart sounds. No murmur. No friction rub. No gallop.   Pulmonary:     Effort: Pulmonary effort is normal.     Breath sounds: Normal breath sounds.      Comments: Lungs clear to auscultation bilaterally.  Symmetric chest rise.  No wheezing, rales, rhonchi. No evidence of respiratory distress.  Abdominal:     Palpations: Abdomen is soft. Abdomen is not rigid.     Tenderness: There is no abdominal tenderness. There is no guarding.     Comments: Abdomen is soft, non-distended, non-tender. No rigidity, No guarding. No peritoneal signs.  Musculoskeletal: Normal range of motion.  Skin:    General: Skin is warm and dry.     Capillary Refill: Capillary refill takes less than 2 seconds.  Neurological:     Mental Status: She is alert and oriented to person, place, and time.  Psychiatric:        Speech: Speech normal.      ED Treatments / Results  Labs (all labs ordered are listed, but only abnormal results are displayed) Labs Reviewed  NOVEL CORONAVIRUS, NAA (HOSPITAL ORDER, SEND-OUT TO REF LAB)    EKG None  Radiology Dg Chest Portable 1 View  Result Date: 10/28/2018 CLINICAL DATA:  Cough. EXAM: PORTABLE CHEST 1 VIEW COMPARISON:  May 06, 2012 FINDINGS: The heart size is mildly enlarged. There is some scarring versus atelectasis at the lung bases, right worse than left. There is no pneumothorax. No large pleural effusion. Aortic calcifications are noted. IMPRESSION: No active disease. Electronically Signed   By: Katherine Mantlehristopher  Green M.D.   On: 10/28/2018 20:42    Procedures Procedures (including critical care time)  Medications Ordered in ED Medications - No data to display   Initial Impression / Assessment and Plan / ED Course  I have reviewed the triage vital signs and the nursing notes.  Pertinent labs & imaging results that were available during my care of the patient were reviewed by me and considered in my medical decision making (see chart for details).        [age] year old female who presents for evaluation of generalized body aches, fatigue and cough that began yesterday.  No fevers.  Patient reports of chest  soreness that only occurs with coughing.  No other chest pain noted.  Denies any difficulty breathing.  No known COVID-19 exposure but does report she has been around her grandchildren. Patient is afebrile, non-toxic appearing, sitting comfortably on examination table. Vital signs reviewed and stable.  Lungs clear to auscultation bilaterally.  No evidence of respiratory distress.  Benign abdominal exam.  Consider infectious source versus viral illness.  Plan for chest x-ray and COVID.  History/physical exam not concerning for ACS etiology, intra-abdominal abnormality.  Chest x-ray reviewed.  Negative for any acute abnormalities.  Discussed with both patient and husband that cover test is pending.  Instructed patient to quarantine until the test comes back. At this time, patient exhibits no emergent life-threatening condition that require further evaluation in ED or admission. Patient had ample opportunity for questions and discussion. All  patient's questions were answered with full understanding. Strict return precautions discussed. Patient expresses understanding and agreement to plan.   Kinsley Roselyn ReefGonzalez Baltazar was evaluated in Emergency Department on 10/28/2018 for the symptoms described in the history of present illness. She was evaluated in the context of the global COVID-19 pandemic, which necessitated consideration that the patient might be at risk for infection with the SARS-CoV-2 virus that causes COVID-19. Institutional protocols and algorithms that pertain to the evaluation of patients at risk for COVID-19 are in a state of rapid change based on information released by regulatory bodies including the CDC and federal and state organizations. These policies and algorithms were followed during the patient's care in the ED.  Portions of this note were generated with Scientist, clinical (histocompatibility and immunogenetics)Dragon dictation software. Dictation errors may occur despite best attempts at proofreading.   Final Clinical Impressions(s) / ED  Diagnoses   Final diagnoses:  Viral illness  Cough    ED Discharge Orders    None       Maxwell CaulLayden, Shanna Strength A, PA-C 10/28/18 2327    Maxwell CaulLayden, Ashlynd Michna A, PA-C 10/28/18 2328    Virgina Norfolkuratolo, Adam, DO 10/29/18 0031

## 2018-10-28 NOTE — ED Notes (Signed)
Pt's name called for a room x1 no answer

## 2018-10-28 NOTE — ED Triage Notes (Signed)
Pt reports cough since yesterday. Pt reports feeling fatigued. Pt denies fever, pain, N/V, or sick contact

## 2018-10-28 NOTE — Discharge Instructions (Signed)
You can take Tylenol or Ibuprofen as directed for pain. You can alternate Tylenol and Ibuprofen every 4 hours. If you take Tylenol at 1pm, then you can take Ibuprofen at 5pm. Then you can take Tylenol again at 9pm.   Your chest xray was reassuring and did not show signs of infection.  As we discussed, you have a COVID test pending. It should be back in 24-48 hours. Until it resulted, you should quarantine and self isolate. If it is positive, you will need to quarantine for 2 weeks.   If you are positive you can take tylenol and ibuprofen for fever or pain.   Return to the Emergency Dept for any chest pain, shortness of breath, vomiting, abdominal pain, and worsening concerns.       Person Under Monitoring Name: Marie Curtis  Location: [address] [place] [id number]   Infection Prevention Recommendations for Individuals Confirmed to have, or Being Evaluated for, 2019 Novel Coronavirus (COVID-19) Infection Who Receive Care at Home  Individuals who are confirmed to have, or are being evaluated for, COVID-19 should follow the prevention steps below until a healthcare provider or local or state health department says they can return to normal activities.  Stay home except to get medical care You should restrict activities outside your home, except for getting medical care. Do not go to work, school, or public areas, and do not use public transportation or taxis.  Call ahead before visiting your doctor Before your medical appointment, call the healthcare provider and tell them that you have, or are being evaluated for, COVID-19 infection. This will help the healthcare providers office take steps to keep other people from getting infected. Ask your healthcare provider to call the local or state health department.  Monitor your symptoms Seek prompt medical attention if your illness is worsening (e.g., difficulty breathing). Before going to your medical appointment, call  the healthcare provider and tell them that you have, or are being evaluated for, COVID-19 infection. Ask your healthcare provider to call the local or state health department.  Wear a facemask You should wear a facemask that covers your nose and mouth when you are in the same room with other people and when you visit a healthcare provider. People who live with or visit you should also wear a facemask while they are in the same room with you.  Separate yourself from other people in your home As much as possible, you should stay in a different room from other people in your home. Also, you should use a separate bathroom, if available.  Avoid sharing household items You should not share dishes, drinking glasses, cups, eating utensils, towels, bedding, or other items with other people in your home. After using these items, you should wash them thoroughly with soap and water.  Cover your coughs and sneezes Cover your mouth and nose with a tissue when you cough or sneeze, or you can cough or sneeze into your sleeve. Throw used tissues in a lined trash can, and immediately wash your hands with soap and water for at least 20 seconds or use an alcohol-based hand rub.  Wash your [employer] Pacific Corporationhands Wash your hands often and thoroughly with soap and water for at least 20 seconds. You can use an alcohol-based hand sanitizer if soap and water are not available and if your hands are not visibly dirty. Avoid touching your eyes, nose, and mouth with unwashed hands.   Prevention Steps for Caregivers and Household Members of Individuals  Confirmed to have, or Being Evaluated for, COVID-19 Infection Being Cared for in the Home  If you live with, or provide care at home for, a person confirmed to have, or being evaluated for, COVID-19 infection please follow these guidelines to prevent infection:  Follow healthcare providers instructions Make sure that you understand and can help the patient follow any healthcare  provider instructions for all care.  Provide for the patients basic needs You should help the patient with basic needs in the home and provide support for getting groceries, prescriptions, and other personal needs.  Monitor the patients symptoms If they are getting sicker, call his or her medical provider and tell them that the patient has, or is being evaluated for, COVID-19 infection. This will help the healthcare providers office take steps to keep other people from getting infected. Ask the healthcare provider to call the local or state health department.  Limit the number of people who have contact with the patient  If possible, have only one caregiver for the patient.  Other household members should stay in another home or place of residence. If this is not possible, they should stay  in another room, or be separated from the patient as much as possible. Use a separate bathroom, if available.  Restrict visitors who do not have an essential need to be in the home.  Keep older adults, very young children, and other sick people away from the patient Keep older adults, very young children, and those who have compromised immune systems or chronic health conditions away from the patient. This includes people with chronic heart, lung, or kidney conditions, diabetes, and cancer.  Ensure good ventilation Make sure that shared spaces in the home have good air flow, such as from an air conditioner or an opened window, weather permitting.  Wash your hands often  Wash your hands often and thoroughly with soap and water for at least 20 seconds. You can use an alcohol based hand sanitizer if soap and water are not available and if your hands are not visibly dirty.  Avoid touching your eyes, nose, and mouth with unwashed hands.  Use disposable paper towels to dry your hands. If not available, use dedicated cloth towels and replace them when they become wet.  Wear a facemask and  gloves  Wear a disposable facemask at all times in the room and gloves when you touch or have contact with the patients blood, body fluids, and/or secretions or excretions, such as sweat, saliva, sputum, nasal mucus, vomit, urine, or feces.  Ensure the mask fits over your nose and mouth tightly, and do not touch it during use.  Throw out disposable facemasks and gloves after using them. Do not reuse.  Wash your hands immediately after removing your facemask and gloves.  If your personal clothing becomes contaminated, carefully remove clothing and launder. Wash your hands after handling contaminated clothing.  Place all used disposable facemasks, gloves, and other waste in a lined container before disposing them with other household waste.  Remove gloves and wash your hands immediately after handling these items.  Do not share dishes, glasses, or other household items with the patient  Avoid sharing household items. You should not share dishes, drinking glasses, cups, eating utensils, towels, bedding, or other items with a patient who is confirmed to have, or being evaluated for, COVID-19 infection.  After the person uses these items, you should wash them thoroughly with soap and water.  Wash laundry thoroughly  Immediately remove and  wash clothes or bedding that have blood, body fluids, and/or secretions or excretions, such as sweat, saliva, sputum, nasal mucus, vomit, urine, or feces, on them.  Wear gloves when handling laundry from the patient.  Read and follow directions on labels of laundry or clothing items and detergent. In general, wash and dry with the warmest temperatures recommended on the label.  Clean all areas the individual has used often  Clean all touchable surfaces, such as counters, tabletops, doorknobs, bathroom fixtures, toilets, phones, keyboards, tablets, and bedside tables, every day. Also, clean any surfaces that may have blood, body fluids, and/or secretions or  excretions on them.  Wear gloves when cleaning surfaces the patient has come in contact with.  Use a diluted bleach solution (e.g., dilute bleach with 1 part bleach and 10 parts water) or a household disinfectant with a label that says EPA-registered for coronaviruses. To make a bleach solution at home, add 1 tablespoon of bleach to 1 quart (4 cups) of water. For a larger supply, add  cup of bleach to 1 gallon (16 cups) of water.  Read labels of cleaning products and follow recommendations provided on product labels. Labels contain instructions for safe and effective use of the cleaning product including precautions you should take when applying the product, such as wearing gloves or eye protection and making sure you have good ventilation during use of the product.  Remove gloves and wash hands immediately after cleaning.  Monitor yourself for signs and symptoms of illness Caregivers and household members are considered close contacts, should monitor their health, and will be asked to limit movement outside of the home to the extent possible. Follow the monitoring steps for close contacts listed on the symptom monitoring form.   ? If you have additional questions, contact your local health department or call the epidemiologist on call at [phone] (available 24/7). ? This guidance is subject to change. For the most up-to-date guidance from [facility], please refer to their website: YouBlogs.pl

## 2018-10-31 LAB — NOVEL CORONAVIRUS, NAA (HOSP ORDER, SEND-OUT TO REF LAB; TAT 18-24 HRS): SARS-CoV-2, NAA: DETECTED — AB

## 2018-11-19 ENCOUNTER — Encounter (HOSPITAL_COMMUNITY): Payer: Self-pay | Admitting: Internal Medicine

## 2018-11-19 ENCOUNTER — Emergency Department (HOSPITAL_COMMUNITY): Payer: HRSA Program

## 2018-11-19 ENCOUNTER — Other Ambulatory Visit: Payer: Self-pay

## 2018-11-19 ENCOUNTER — Inpatient Hospital Stay (HOSPITAL_COMMUNITY)
Admission: EM | Admit: 2018-11-19 | Discharge: 2018-11-22 | DRG: 177 | Disposition: A | Discharge status: Home / Self Care | Payer: HRSA Program | Attending: Internal Medicine | Admitting: Internal Medicine

## 2018-11-19 DIAGNOSIS — A4189 Other specified sepsis: Secondary | ICD-10-CM | POA: Diagnosis present

## 2018-11-19 DIAGNOSIS — J189 Pneumonia, unspecified organism: Secondary | ICD-10-CM | POA: Diagnosis not present

## 2018-11-19 DIAGNOSIS — I10 Essential (primary) hypertension: Secondary | ICD-10-CM | POA: Diagnosis present

## 2018-11-19 DIAGNOSIS — R0602 Shortness of breath: Secondary | ICD-10-CM | POA: Diagnosis not present

## 2018-11-19 DIAGNOSIS — J9601 Acute respiratory failure with hypoxia: Secondary | ICD-10-CM | POA: Diagnosis present

## 2018-11-19 DIAGNOSIS — J440 Chronic obstructive pulmonary disease with acute lower respiratory infection: Secondary | ICD-10-CM | POA: Diagnosis present

## 2018-11-19 DIAGNOSIS — A419 Sepsis, unspecified organism: Secondary | ICD-10-CM

## 2018-11-19 DIAGNOSIS — E872 Acidosis, unspecified: Secondary | ICD-10-CM

## 2018-11-19 DIAGNOSIS — R778 Other specified abnormalities of plasma proteins: Secondary | ICD-10-CM

## 2018-11-19 DIAGNOSIS — R0902 Hypoxemia: Secondary | ICD-10-CM

## 2018-11-19 DIAGNOSIS — N179 Acute kidney failure, unspecified: Secondary | ICD-10-CM | POA: Diagnosis present

## 2018-11-19 DIAGNOSIS — J1289 Other viral pneumonia: Secondary | ICD-10-CM | POA: Diagnosis present

## 2018-11-19 DIAGNOSIS — U071 COVID-19: Secondary | ICD-10-CM | POA: Diagnosis present

## 2018-11-19 DIAGNOSIS — R9431 Abnormal electrocardiogram [ECG] [EKG]: Secondary | ICD-10-CM | POA: Diagnosis not present

## 2018-11-19 DIAGNOSIS — R6520 Severe sepsis without septic shock: Secondary | ICD-10-CM | POA: Diagnosis not present

## 2018-11-19 DIAGNOSIS — J1282 Pneumonia due to coronavirus disease 2019: Secondary | ICD-10-CM | POA: Diagnosis present

## 2018-11-19 DIAGNOSIS — R7989 Other specified abnormal findings of blood chemistry: Secondary | ICD-10-CM | POA: Diagnosis not present

## 2018-11-19 LAB — COMPREHENSIVE METABOLIC PANEL
ALT: 1035 U/L — ABNORMAL HIGH (ref 0–44)
AST: 321 U/L — ABNORMAL HIGH (ref 15–41)
Albumin: 2.3 g/dL — ABNORMAL LOW (ref 3.5–5.0)
Alkaline Phosphatase: 80 U/L (ref 38–126)
Anion gap: 11 (ref 5–15)
BUN: 54 mg/dL — ABNORMAL HIGH (ref 8–23)
CO2: 19 mmol/L — ABNORMAL LOW (ref 22–32)
Calcium: 7.4 mg/dL — ABNORMAL LOW (ref 8.9–10.3)
Chloride: 103 mmol/L (ref 98–111)
Creatinine, Ser: 2.17 mg/dL — ABNORMAL HIGH (ref 0.44–1.00)
GFR calc Af Amer: 27 mL/min — ABNORMAL LOW (ref >60)
GFR calc non Af Amer: 23 mL/min — ABNORMAL LOW (ref >60)
Glucose, Bld: 186 mg/dL — ABNORMAL HIGH (ref 70–99)
Potassium: 3 mmol/L — ABNORMAL LOW (ref 3.5–5.1)
Sodium: 133 mmol/L — ABNORMAL LOW (ref 135–145)
Total Bilirubin: 0.9 mg/dL (ref 0.3–1.2)
Total Protein: 7.1 g/dL (ref 6.5–8.1)

## 2018-11-19 LAB — CBC WITH DIFFERENTIAL/PLATELET
Abs Immature Granulocytes: 0.78 10*3/uL — ABNORMAL HIGH (ref 0.00–0.07)
Basophils Absolute: 0.1 10*3/uL (ref 0.0–0.1)
Basophils Relative: 1 %
Eosinophils Absolute: 0.2 10*3/uL (ref 0.0–0.5)
Eosinophils Relative: 1 %
HCT: 37.8 % (ref 36.0–46.0)
Hemoglobin: 12.7 g/dL (ref 12.0–15.0)
Immature Granulocytes: 6 %
Lymphocytes Relative: 10 %
Lymphs Abs: 1.5 10*3/uL (ref 0.7–4.0)
MCH: 30.2 pg (ref 26.0–34.0)
MCHC: 33.6 g/dL (ref 30.0–36.0)
MCV: 90 fL (ref 80.0–100.0)
Monocytes Absolute: 0.3 10*3/uL (ref 0.1–1.0)
Monocytes Relative: 2 %
Neutro Abs: 11.5 10*3/uL — ABNORMAL HIGH (ref 1.7–7.7)
Neutrophils Relative %: 80 %
Platelets: 383 10*3/uL (ref 150–400)
RBC: 4.2 MIL/uL (ref 3.87–5.11)
RDW: 12.8 % (ref 11.5–15.5)
WBC: 14.3 10*3/uL — ABNORMAL HIGH (ref 4.0–10.5)
nRBC: 1.5 % — ABNORMAL HIGH (ref 0.0–0.2)

## 2018-11-19 LAB — LACTIC ACID, PLASMA: Lactic Acid, Venous: 1.5 mmol/L (ref 0.5–1.9)

## 2018-11-19 LAB — D-DIMER, QUANTITATIVE: D-Dimer, Quant: >20 ug/mL-FEU — ABNORMAL HIGH (ref 0.00–0.50)

## 2018-11-19 LAB — TROPONIN I (HIGH SENSITIVITY)
Troponin I (High Sensitivity): 206 ng/L (ref <18)
Troponin I (High Sensitivity): 155 ng/L (ref <18)

## 2018-11-19 LAB — ABO/RH: ABO/RH(D): O POS

## 2018-11-19 LAB — BRAIN NATRIURETIC PEPTIDE: B Natriuretic Peptide: 280.4 pg/mL — ABNORMAL HIGH (ref 0.0–100.0)

## 2018-11-19 MED ORDER — TRAMADOL HCL 50 MG PO TABS: 50.0000 mg | ORAL_TABLET | Freq: Four times a day (QID) | ORAL | Status: DC | PRN

## 2018-11-19 MED ORDER — ONDANSETRON HCL 4 MG PO TABS: 4.0000 mg | ORAL_TABLET | Freq: Four times a day (QID) | ORAL | Status: DC | PRN

## 2018-11-19 MED ORDER — ONDANSETRON HCL 4 MG/2ML IJ SOLN: 4.0000 mg | Freq: Four times a day (QID) | INTRAMUSCULAR | Status: DC | PRN

## 2018-11-19 MED ORDER — POTASSIUM CHLORIDE CRYS ER 20 MEQ PO TBCR
20.0000 meq | EXTENDED_RELEASE_TABLET | Freq: Once | ORAL | Status: AC
  Administered 2018-11-19: 20 meq via ORAL
  Filled 2018-11-19: qty 1

## 2018-11-19 MED ORDER — DEXTROSE-NACL 5-0.45 % IV SOLN
INTRAVENOUS | Status: DC
  Administered 2018-11-20: via INTRAVENOUS

## 2018-11-19 MED ORDER — DOCUSATE SODIUM 100 MG PO CAPS
100.0000 mg | ORAL_CAPSULE | Freq: Two times a day (BID) | ORAL | Status: DC
  Administered 2018-11-19 – 2018-11-21 (×3): 100 mg via ORAL
  Filled 2018-11-19 (×5): qty 1

## 2018-11-19 MED ORDER — PANTOPRAZOLE SODIUM 40 MG IV SOLR
40.0000 mg | Freq: Every day | INTRAVENOUS | Status: DC
  Administered 2018-11-19: 40 mg via INTRAVENOUS
  Filled 2018-11-19: qty 40

## 2018-11-19 MED ORDER — SODIUM CHLORIDE 0.9 % IV SOLN
INTRAVENOUS | Status: DC
  Administered 2018-11-19 – 2018-11-21 (×3): via INTRAVENOUS

## 2018-11-19 MED ORDER — ACETAMINOPHEN 500 MG PO TABS: 500.0000 mg | ORAL_TABLET | Freq: Four times a day (QID) | ORAL | Status: DC | PRN

## 2018-11-19 MED ORDER — METOPROLOL TARTRATE 25 MG PO TABS
25.0000 mg | ORAL_TABLET | Freq: Two times a day (BID) | ORAL | Status: DC
  Administered 2018-11-20 – 2018-11-22 (×5): 25 mg via ORAL
  Filled 2018-11-19 (×6): qty 1

## 2018-11-19 MED ORDER — DEXAMETHASONE 6 MG PO TABS
6.0000 mg | ORAL_TABLET | Freq: Every day | ORAL | Status: DC
  Administered 2018-11-19: 6 mg via ORAL
  Filled 2018-11-19: qty 2

## 2018-11-19 MED ORDER — ENOXAPARIN SODIUM 30 MG/0.3ML ~~LOC~~ SOLN: 30.0000 mg | Freq: Every day | SUBCUTANEOUS | Status: DC

## 2018-11-19 NOTE — ED Notes (Signed)
Pt informed of moving to [facility] for Care. Pt. verbalized consent for transport. Signature pad not available

## 2018-11-19 NOTE — H&P (Addendum)
History and Physical    Marie Curtis ZDG:[id number] DOB: 09/19/1955 DOA: 11/19/2018  PCP: System, Provider Not In (Confirm with patient/family/NH records and if not entered, [facility]) Patient coming from: Patient is coming from home  I have personally briefly reviewed patient's old medical records [facility]  Chief Complaint: Progressive shortness of breath, COVID-19 related pneumonia  HPI: Marie Curtis is a 63 y.o. female with medical history significant of Marie Curtis tested positive for COVID-19 October 28, 2018.  She was seen and treated for viral upper respiratory infection and sent home.  She did not improve and over the last 2 to 3 days has had increasing shortness of breath.  She called EMS and in the field she had an O2 sat in the 70% range.  In the emergency department the patient was put to 6 L high flow nasal cannula with oxygen saturation 94% range.  She remained tachypneic.  Patient had a positive chest x-ray with typical viral infiltrates, Elevation in all of her inflammatory markers.  Was noted to have acute kidney injury and a mild leukocytosis of 14,000 was present.  Lactic acid was normal.  Patient has no significant past medical history but states she is  being treated for hypertension.  Patient remains tachypneic and short of breath.  [facility] were called for admission.    ED Course: In the emergency department the patient was stabilized.  Continuous high flow oxygen was continued.  Routine labs were ordered including inflammatory markers as noted above.  Review of Systems: As per HPI otherwise 10 point review of systems negative.    Past Medical History:  Diagnosis Date  . Headache(784.0)   . Hypertension     Past Surgical History:  Procedure Laterality Date  . CHOLECYSTECTOMY  05/06/2012   Procedure: LAPAROSCOPIC CHOLECYSTECTOMY;  Surgeon: Axel FillerArmando Ramirez, MD;  [facility];  Service:  General;  Laterality: N/A;  . NO PAST SURGERIES       reports that she has never smoked. She has never used smokeless tobacco. She reports that she does not drink alcohol or use drugs.   Social Hx - married, 2 children, 7 grandchildren. 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  No Known Allergies  No family history on file. Language barrier to getting more history     Prior to Admission medications   Medication Sig Start Date End Date Taking? Authorizing Provider  acetaminophen (TYLENOL) 500 MG tablet Take 500 mg by mouth every 6 (six) hours as needed. For pain/fever    [provider]  oxyCODONE-acetaminophen (PERCOCET/ROXICET) 5-325 MG per tablet Take 2 tablets by mouth every 4 (four) hours as needed for pain. Patient not taking: Reported on 11/19/2018 04/12/12   Emilia BeckSzekalski, Kaitlyn, PA-C  oxyCODONE-acetaminophen (ROXICET) 5-325 MG per tablet Take 1 tablet by mouth every 4 (four) hours as needed for pain. Patient not taking: Reported on 11/19/2018 05/06/12   Axel Filleramirez, Armando, MD    Physical Exam: Vitals:   11/19/18 1600 11/19/18 1645 11/19/18 1715 11/19/18 1800  BP: 109/66 (!) 110/59 (!) 114/58 107/76  Pulse: 83 84 84 81  Resp: (!) 24 (!) 24 (!) 31 17  Temp:      TempSrc:      SpO2: 95% 97% 95% 98%    Constitutional: NAD, calm, comfortable Vitals:   11/19/18 1600 11/19/18 1645 11/19/18 1715 11/19/18 1800  BP: 109/66 (!) 110/59 (!) 114/58 107/76  Pulse: 83 84  84 81  Resp: (!) 24 (!) 24 (!) 31 17  Temp:      TempSrc:      SpO2: 95% 97% 95% 98%   General -  Overweight woman in no acute distress on highflow Oxygen Eyes: PERRL, lids and conjunctivae normal ENMT: Mucous membranes are moist. Posterior pharynx clear of any exudate or lesions.Normal dentition.  Neck: obese. normal, supple, no masses, no thyromegaly Respiratory: Decreased shallow breath sounds, bilateral crackles at the bases.  No wheezing noted.  Cardiovascular: Regular rate and rhythm, no murmurs / rubs  / gallops. No extremity edema. 2+ pedal pulses. No carotid bruits.  Abdomen: Obese ,no tenderness, no masses palpated. No hepatosplenomegaly. Bowel sounds positive.  Musculoskeletal: no clubbing / cyanosis. No joint deformity upper and lower extremities. Good ROM, no contractures. Normal muscle tone.  Skin: no rashes, lesions, ulcers. No induration Neurologic: CN 2-12 grossly intact. Sensation intact, DTR normal. Strength 5/5 in all 4.  Psychiatric: Normal judgment and insight. Alert and oriented x 3. Normal mood.     Labs on Admission: I have personally reviewed following labs and imaging studies  CBC: Recent Labs  Lab 11/19/18 1629  WBC 14.3*  NEUTROABS 11.5*  HGB 12.7  HCT 37.8  MCV 90.0  PLT 383   Basic Metabolic Panel: Recent Labs  Lab 11/19/18 1629  NA 133*  K 3.0*  CL 103  CO2 19*  GLUCOSE 186*  BUN 54*  CREATININE 2.17*  CALCIUM 7.4*   GFR: CrCl cannot be calculated (Unknown ideal weight.). Liver Function Tests: Recent Labs  Lab 11/19/18 1629  AST 321*  ALT 1,035*  ALKPHOS 80  BILITOT 0.9  PROT 7.1  ALBUMIN 2.3*   No results for input(s): LIPASE, AMYLASE in the last 168 hours. No results for input(s): AMMONIA in the last 168 hours. Coagulation Profile: No results for input(s): INR, PROTIME in the last 168 hours. Cardiac Enzymes: No results for input(s): CKTOTAL, CKMB, CKMBINDEX, TROPONINI in the last 168 hours. BNP (last 3 results) No results for input(s): PROBNP in the last 8760 hours. HbA1C: No results for input(s): HGBA1C in the last 72 hours. CBG: No results for input(s): GLUCAP in the last 168 hours. Lipid Profile: No results for input(s): CHOL, HDL, LDLCALC, TRIG, CHOLHDL, LDLDIRECT in the last 72 hours. Thyroid Function Tests: No results for input(s): TSH, T4TOTAL, FREET4, T3FREE, THYROIDAB in the last 72 hours. Anemia Panel: No results for input(s): VITAMINB12, FOLATE, FERRITIN, TIBC, IRON, RETICCTPCT in the last 72 hours. Urine  analysis:    Component Value Date/Time   COLORURINE Straw 10/29/2011 1240   APPEARANCEUR Clear 10/29/2011 1240   LABSPEC 1.008 10/29/2011 1240   PHURINE 6.0 10/29/2011 1240   GLUCOSEU Negative 10/29/2011 1240   HGBUR Negative 10/29/2011 1240   BILIRUBINUR Negative 10/29/2011 1240   KETONESUR Negative 10/29/2011 1240   PROTEINUR Negative 10/29/2011 1240   NITRITE Negative 10/29/2011 1240   LEUKOCYTESUR Negative 10/29/2011 1240    Radiological Exams on Admission: Dg Chest Port 1 View  Result Date: 11/19/2018 CLINICAL DATA:  COVID-19 positive, shortness of breath EXAM: PORTABLE CHEST 1 VIEW COMPARISON:  October 28, 2018 FINDINGS: The mediastinal contour is normal. Heart size is mildly enlarged. Hazy ground-glass opacities identified throughout both lungs. No pleural effusion is noted. The bony structures are stable. IMPRESSION: Hazy ground-glass opacity identified throughout both lungs. This can be seen in viral pneumonia such as COVID-19. Electronically Signed   By: Sherian ReinWei-Chen  Lin M.D.   On: 11/19/2018 16:09  EKG: Independently reviewed.  Normal sinus rhythm.  LVH.  T wave inversions in the 1 through V4, prolonged QT interval.  Does not appear to be acute changes  Assessment/Plan Active Problems:   Pneumonia due to COVID-19 virus  (please populate well all problems here in Problem List. (For example, if patient is on BP meds at home and you resume or decide to hold them, it is a problem that needs to be her. Same for CAD, COPD, HLD and so on)   1.  COVID-19 related pneumonia -patient presents as code +October 28, 2018.  She has had progressive symptoms and now with significant hypoxia with an O2 sat of 70% on room air.  He responds well to high flow oxygen at 6 L with O2 sats in the 94% range.  She remains tachypneic with a respiratory rate of 18-20.  She is able to speak whole sentences and denies being in severe respiratory distress.  Chest x-ray is positive for viral-looking infiltrates.   Laboratory markers are elevated Plan admit to progressive bed at [place] to dose remdesivir  Elevated transaminases prevents the administration of back tempera  Dexamethasone 6 mg p.o. daily x10  Continue oxygen support and supportive care  2.  Cardiovascular -patient with no prior history of cardiovascular disease.  She does say that she is hypertensive and takes blood pressure medication at home.  That medication is not present on her chart.  Patient complains of chest discomfort which in an atypical fashion for cardiac cause and suspect this is due to respiratory difficulty.  EKG reveals LVH and T wave inversion but no signs of acute change. Plan metoprolol 25 mg twice daily  Serial enzymes  Repeat EKG in a.m.  3.  Renal -patient with acute kidney injury most likely secondary to [place].  Creatinine is 2.17. K 3.0 Plan hydration with D5 half-normal saline.  Potassium replacement with a single dose of K-Dur 20 milliequivalents  Follow-up lab in the morning  4.  Elevated transaminases -likely COVID related.  No immediate intervention required. Plan follow-up laboratory in the a.m.  DVT prophylaxis: Lovenox (Lovenox/Heparin/SCD's/anticoagulated/None (if comfort care) Code Status: No code (Full/Partial (specify details) Family Communication: Attempted to call husband at [phone] and got no answer (Specify name, relationship. Do not write "discussed with patient". Specify tel # if discussed over the phone) Disposition Plan: Home in 5 to 7 days (specify when and where you expect patient to be discharged) Consults called: None (with names) Admission status: Patient-progressive bed (inpatient / obs / tele / medical floor / SDU)  Patient was seen with the examiner using full PPE including a 95 mask gown, double gloved and face shield.  Proper gowning and doffing were carried out.   Adella Hare MD [facility] Pager [phone]  If 7PM-7AM, please contact night-coverage  [email or web address] Password [facility]  11/19/2018, 6:42 PM        DVT prophylaxis:  Lovenox  Code Status:  Full - confirmed with patient Family Communication: None present; I spoke with the patient's daughter by telephone. Disposition Plan:  Home once clinically improved Consults called: None  Admission status: Admit - It is my clinical opinion that admission to INPATIENT is reasonable and necessary because of the expectation that this patient will require hospital care that crosses at least 2 midnights to treat this condition based on the medical complexity of the problems presented.  Given the aforementioned information, the predictability of an adverse outcome is felt to be significant.

## 2018-11-19 NOTE — ED Notes (Signed)
Secretary at [phone] at [facility] informed pt leaving [facility] ED at this time.

## 2018-11-19 NOTE — ED Notes (Signed)
Family updated by this RN  [phone] Valarie Merino (Pt's Husband)

## 2018-11-19 NOTE — ED Notes (Addendum)
[facility]: Tammy, to pick up pt. 30 minutes

## 2018-11-19 NOTE — ED Triage Notes (Signed)
Pt BIB [facility] EMS for shortness of breath. Per EMS patient tested positive for covid on 7/10. Pt was 84% on room air for EMS and came up to 94% on 6L [place]. Pt 70% on room air upon ED arrival, pt 93-94% on 6L [place] at present time.

## 2018-11-19 NOTE — ED Notes (Signed)
Secretary To have RN to call back. Informed [facility]  To pick up pt in 30 minutes.

## 2018-11-19 NOTE — ED Notes (Signed)
Carlton Adam, family member, can be reached at [phone]

## 2018-11-19 NOTE — ED Notes (Signed)
ED TO INPATIENT HANDOFF REPORT  ED Nurse Name and Phone #:  [phone]  S Name/Age/Gender Marie Curtis 63 y.o. female Room/Bed: 029C/029C  Code Status   Code Status: Full Code  Home/SNF/Other Home Patient oriented to: self, place, time and situation Is this baseline? Yes   Triage Complete: Triage complete  Chief Complaint sob  Triage Note [facility] for shortness of breath. Per EMS patient tested positive for covid on 7/10. Pt was 84% on room air for EMS and came up to 94% on 6L [place]. Pt 70% on room air upon ED arrival, pt 93-94% on 6L [place] at present time.    Allergies No Known Allergies  Level of Care/Admitting Diagnosis ED Disposition    ED Disposition Condition Comment   Admit  Hospital Area: [facility] [[id number]]  Level of Care: Progressive [102]  Covid Evaluation: Confirmed COVID Positive  Diagnosis: Pneumonia due to COVID-19 virus [[id number]][[id number]]  Admitting Physician: Jacques NavyNORINS, MICHAEL E [[id number]]  Attending Physician: Illene RegulusNORINS, MICHAEL E [[id number]]  Estimated length of stay: 5 - 7 days  Certification:: I certify this patient will need inpatient services for at least 2 midnights  PT Class (Do Not Modify): Inpatient [101]  PT Acc Code (Do Not Modify): Private [1]       B Medical/Surgery History Past Medical History:  Diagnosis Date  . Headache(784.0)   . Hypertension    Past Surgical History:  Procedure Laterality Date  . CHOLECYSTECTOMY  05/06/2012   Procedure: LAPAROSCOPIC CHOLECYSTECTOMY;  Surgeon: Axel FillerArmando Ramirez, MD;  Location: [facility] OR;  Service: General;  Laterality: N/A;  . NO PAST SURGERIES       A IV Location/Drains/Wounds Patient Lines/Drains/Airways Status   Active Line/Drains/Airways    Name:   Placement date:   Placement time:   Site:   Days:   Peripheral IV 11/19/18 Right Antecubital   11/19/18    1628    Antecubital   less than 1   Incision 05/06/12 Abdomen Other (Comment)   05/06/12    1443     2388   Incision  - 3 Ports Abdomen 1: Umbilicus 2: Mid;Left 3: Left;Lower   05/06/12    -     2388          Intake/Output Last 24 hours No intake or output data in the 24 hours ending 11/19/18 2049  Labs/Imaging Results for orders placed or performed during the hospital encounter of 11/19/18 (from the past 48 hour(s))  Comprehensive metabolic panel     Status: Abnormal   Collection Time: 11/19/18  4:29 PM  Result Value Ref Range   Sodium 133 (L) 135 - 145 mmol/L   Potassium 3.0 (L) 3.5 - 5.1 mmol/L   Chloride 103 98 - 111 mmol/L   CO2 19 (L) 22 - 32 mmol/L   Glucose, Bld 186 (H) 70 - 99 mg/dL   BUN 54 (H) 8 - 23 mg/dL   Creatinine, Ser 1.322.17 (H) 0.44 - 1.00 mg/dL   Calcium 7.4 (L) 8.9 - 10.3 mg/dL   Total Protein 7.1 6.5 - 8.1 g/dL   Albumin 2.3 (L) 3.5 - 5.0 g/dL   AST 440321 (H) 15 - 41 U/L   ALT 1,035 (H) 0 - 44 U/L   Alkaline Phosphatase 80 38 - 126 U/L   Total Bilirubin 0.9 0.3 - 1.2 mg/dL   GFR calc non Af Amer 23 (L) >60 mL/min   GFR calc Af Amer 27 (L) >60 mL/min  Anion gap 11 5 - 15    Comment: Performed at [facility], [address] [place], [place] [address]  CBC with Differential/Platelet     Status: Abnormal   Collection Time: 11/19/18  4:29 PM  Result Value Ref Range   WBC 14.3 (H) 4.0 - 10.5 K/uL   RBC 4.20 3.87 - 5.11 MIL/uL   Hemoglobin 12.7 12.0 - 15.0 g/dL   HCT 57.837.8 46.936.0 - 62.946.0 %   MCV 90.0 80.0 - 100.0 fL   MCH 30.2 26.0 - 34.0 pg   MCHC 33.6 30.0 - 36.0 g/dL   RDW 52.812.8 41.311.5 - 24.415.5 %   Platelets 383 150 - 400 K/uL   nRBC 1.5 (H) 0.0 - 0.2 %   Neutrophils Relative % 80 %   Neutro Abs 11.5 (H) 1.7 - 7.7 K/uL   Lymphocytes Relative 10 %   Lymphs Abs 1.5 0.7 - 4.0 K/uL   Monocytes Relative 2 %   Monocytes Absolute 0.3 0.1 - 1.0 K/uL   Eosinophils Relative 1 %   Eosinophils Absolute 0.2 0.0 - 0.5 K/uL   Basophils Relative 1 %   Basophils Absolute 0.1 0.0 - 0.1 K/uL   RBC Morphology MORPHOLOGY UNREMARKABLE    Immature Granulocytes 6 %   Abs Immature  Granulocytes 0.78 (H) 0.00 - 0.07 K/uL    Comment: Performed at [facility], [address] [place], [place] [address]  Lactic acid, plasma     Status: None   Collection Time: 11/19/18  4:29 PM  Result Value Ref Range   Lactic Acid, Venous 1.5 0.5 - 1.9 mmol/L    Comment: Performed at [facility], [address] [place], [place] [address]  Brain natriuretic peptide     Status: Abnormal   Collection Time: 11/19/18  4:29 PM  Result Value Ref Range   B Natriuretic Peptide 280.4 (H) 0.0 - 100.0 pg/mL    Comment: Performed at [facility], [address] [place], [place] [address]  D-dimer, quantitative (not at [facility])     Status: Abnormal   Collection Time: 11/19/18  4:29 PM  Result Value Ref Range   D-Dimer, Quant >20.00 (H) 0.00 - 0.50 ug/mL-FEU    Comment: REPEATED TO VERIFY CRITICAL RESULT CALLED TO, READ BACK BY AND VERIFIED WITH: N.KOONTZ,RN @ 1804 11/19/2018 WEBBERJ (NOTE) At the manufacturer cut-off of 0.50 ug/mL FEU, this assay has been documented to exclude PE with a sensitivity and negative predictive value of 97 to 99%.  At this time, this assay has not been approved by the FDA to exclude DVT/VTE. Results should be correlated with clinical presentation. Performed at [facility], [address] [place], [place] [address]   Troponin I (High Sensitivity)     Status: Abnormal   Collection Time: 11/19/18  4:29 PM  Result Value Ref Range   Troponin I (High Sensitivity) 206 (HH) <18 ng/L    Comment: CRITICAL RESULT CALLED TO, READ BACK BY AND VERIFIED WITH: NICOLE KOONTZ RN AT 0533 11/19/2018 BY WOOLLENK (NOTE) Elevated high sensitivity troponin I (hsTnI) values and significant  changes across serial measurements may suggest ACS but many other  chronic and acute conditions are known to elevate hsTnI results.  Refer to the Links section for chest pain algorithms and additional  guidance. Performed at [facility], [address]  [place], [place] [address]    Dg Chest Port 1 View  Result Date: 11/19/2018 CLINICAL DATA:  COVID-19 positive, shortness of breath EXAM: PORTABLE CHEST 1 VIEW COMPARISON:  October 28, 2018 FINDINGS: The mediastinal contour is normal. Heart size is mildly enlarged. Hazy ground-glass opacities identified throughout both lungs. No pleural effusion is noted. The bony structures are stable. IMPRESSION: Hazy ground-glass opacity identified throughout both lungs. This can be seen in viral pneumonia such as COVID-19. Electronically Signed   By: Sherian ReinWei-Chen  Lin M.D.   On: 11/19/2018 16:09    Pending Labs Unresulted Labs (From admission, onward)    Start     Ordered   11/26/18 0500  Creatinine, serum  (enoxaparin (LOVENOX)    CrCl >/= 30 ml/min)  Weekly,   R    Comments: while on enoxaparin therapy    11/19/18 1838   11/20/18 0500  CBC with Differential/Platelet  Daily,   R     11/19/18 1838   11/20/18 0500  Comprehensive metabolic panel  Daily,   R     11/19/18 1838   11/19/18 1825  HIV antibody (Routine Testing)  Once,   STAT     11/19/18 1838   11/19/18 1825  ABO/Rh  Once,   STAT     11/19/18 1838   11/19/18 1825  CBC  (enoxaparin (LOVENOX)    CrCl >/= 30 ml/min)  Once,   STAT    Comments: Baseline for enoxaparin therapy IF NOT ALREADY DRAWN.  Notify MD if PLT < 100 K.    11/19/18 1838   11/19/18 1825  Creatinine, serum  (enoxaparin (LOVENOX)    CrCl >/= 30 ml/min)  Once,   STAT    Comments: Baseline for enoxaparin therapy IF NOT ALREADY DRAWN.    11/19/18 1838   11/19/18 1530  Culture, blood (Routine X 2) w Reflex to ID Panel  BLOOD CULTURE X 2,   R (with STAT occurrences)     11/19/18 1529          Vitals/Pain Today's Vitals   11/19/18 1920 11/19/18 1942 11/19/18 1943 11/19/18 2030  BP: 121/71   122/71  Pulse: 88   87  Resp: (!) 24   (!) 31  Temp:      TempSrc:      SpO2: 97%   98%  Weight:   80.7 kg   Height:   5\' 4"  (1.626 m)   PainSc:  6       Isolation Precautions Airborne  and Contact precautions  Medications Medications  0.9 %  sodium chloride infusion ( Intravenous New Bag/Given 11/19/18 1628)  acetaminophen (TYLENOL) tablet 500 mg (has no administration in time range)  enoxaparin (LOVENOX) injection 40 mg (has no administration in time range)  dextrose 5 %-0.45 % sodium chloride infusion (has no administration in time range)  pantoprazole (PROTONIX) injection 40 mg (has no administration in time range)  traMADol (ULTRAM) tablet 50 mg (has no administration in time range)  docusate sodium (COLACE) capsule 100 mg (has no administration in time range)  ondansetron (ZOFRAN) tablet 4 mg (has no administration in time range)    Or  ondansetron (ZOFRAN) injection 4 mg (has no administration in time range)  metoprolol tartrate (LOPRESSOR) tablet 25 mg (has no administration in time range)  dexamethasone (DECADRON) tablet 6 mg (has no administration in time range)  potassium chloride SA (K-DUR) CR tablet 20 mEq (has no administration in time range)    Mobility walks with person assist     Focused Assessments Pulmonary Assessment Handoff:  Lung sounds: L Breath Sounds: Expiratory wheezes R Breath  Sounds: Expiratory wheezes O2 Device: High Flow Nasal Cannula O2 Flow Rate (L/min): 6 L/min      R Recommendations: See Admitting Provider Note  Report given to:   Additional Notes:

## 2018-11-19 NOTE — ED Notes (Signed)
Second call to [facility] to give report. No answer on first call.

## 2018-11-19 NOTE — ED Provider Notes (Signed)
[facility] Provider Note   CSN: [id number] Arrival date & time: 11/19/18  1453    History   Chief Complaint Chief Complaint  Patient presents with  . Shortness of Breath  . Chest Pain    HPI Marie Curtis is a 63 y.o. female.     Patient tested positive for COVID-19 infection on July 10.  Patient was diagnosed at that time with a viral respiratory type infection.  Had the outpatient COVID testing done on that day.  That did come back positive.  Patient states that for she has felt short of breath some since 5 days after that evaluation so July 15.  Felt much more short of breath in the last 2 to 3 days.  Denies any Wells being sick at home.  The shortness of breath is associated with some chest tightness.  Or discomfort.  Interpreter was used to get this information.  Patient denies any fevers.  Past medical history significant for hypertension.  Patient's had her gallbladder removed.     Past Medical History:  Diagnosis Date  . Headache(784.0)   . Hypertension     There are no active problems to display for this patient.   Past Surgical History:  Procedure Laterality Date  . CHOLECYSTECTOMY  05/06/2012   Procedure: LAPAROSCOPIC CHOLECYSTECTOMY;  Surgeon: Axel FillerArmando Ramirez, MD;  Location: [facility] OR;  Service: General;  Laterality: N/A;  . NO PAST SURGERIES       OB History   No obstetric history on file.      Home Medications    Prior to Admission medications   Medication Sig Start Date End Date Taking? Authorizing Provider  acetaminophen (TYLENOL) 500 MG tablet Take 500 mg by mouth every 6 (six) hours as needed. For pain/fever    [provider]  oxyCODONE-acetaminophen (PERCOCET/ROXICET) 5-325 MG per tablet Take 2 tablets by mouth every 4 (four) hours as needed for pain. 04/12/12   Emilia BeckSzekalski, Kaitlyn, PA-C  oxyCODONE-acetaminophen (ROXICET) 5-325 MG per tablet Take 1 tablet by mouth every 4 (four) hours as  needed for pain. 05/06/12   Axel Filleramirez, Armando, MD    Family History No family history on file.  Social History Social History   Tobacco Use  . Smoking status: Never Smoker  . Smokeless tobacco: Never Used  Substance Use Topics  . Alcohol use: No  . Drug use: No     Allergies   Patient has no known allergies.   Review of Systems Review of Systems  Constitutional: Negative for chills and fever.  HENT: Negative for rhinorrhea and sore throat.   Eyes: Negative for visual disturbance.  Respiratory: Positive for shortness of breath. Negative for cough.   Cardiovascular: Positive for chest pain. Negative for leg swelling.  Gastrointestinal: Negative for abdominal pain, diarrhea, nausea and vomiting.  Genitourinary: Negative for dysuria.  Musculoskeletal: Negative for back pain and neck pain.  Skin: Negative for rash.  Neurological: Negative for dizziness, light-headedness and headaches.  Hematological: Does not bruise/bleed easily.  Psychiatric/Behavioral: Negative for confusion.     Physical Exam Updated Vital Signs BP (!) 114/58   Pulse 84   Temp 98.5 F (36.9 C) (Oral)   Resp (!) 31   SpO2 95%   Physical Exam Constitutional:      Appearance: Normal appearance. She is well-developed.  Eyes:     Extraocular Movements: Extraocular movements intact.     Pupils: Pupils are equal, round, and reactive to light.  Neck:  Musculoskeletal: Normal range of motion and neck supple.  Cardiovascular:     Rate and Rhythm: Normal rate and regular rhythm.  Pulmonary:     Effort: Respiratory distress present.  Abdominal:     Tenderness: There is no abdominal tenderness.  Musculoskeletal: Normal range of motion.        General: No swelling.  Skin:    Findings: No rash.  Neurological:     General: No focal deficit present.     Mental Status: She is alert and oriented to person, place, and time.     Cranial Nerves: No cranial nerve deficit.     Sensory: No sensory deficit.      Motor: No weakness.      ED Treatments / Results  Labs (all labs ordered are listed, but only abnormal results are displayed) Labs Reviewed  COMPREHENSIVE METABOLIC PANEL - Abnormal; Notable for the following components:      Result Value   Sodium 133 (*)    Potassium 3.0 (*)    CO2 19 (*)    Glucose, Bld 186 (*)    BUN 54 (*)    Creatinine, Ser 2.17 (*)    Calcium 7.4 (*)    Albumin 2.3 (*)    AST 321 (*)    ALT 1,035 (*)    GFR calc non Af Amer 23 (*)    GFR calc Af Amer 27 (*)    All other components within normal limits  CBC WITH DIFFERENTIAL/PLATELET - Abnormal; Notable for the following components:   WBC 14.3 (*)    nRBC 1.5 (*)    Neutro Abs 11.5 (*)    Abs Immature Granulocytes 0.78 (*)    All other components within normal limits  BRAIN NATRIURETIC PEPTIDE - Abnormal; Notable for the following components:   B Natriuretic Peptide 280.4 (*)    All other components within normal limits  TROPONIN I (HIGH SENSITIVITY) - Abnormal; Notable for the following components:   Troponin I (High Sensitivity) 206 (*)    All other components within normal limits  CULTURE, BLOOD (ROUTINE X 2)  CULTURE, BLOOD (ROUTINE X 2)  LACTIC ACID, PLASMA  D-DIMER, QUANTITATIVE (NOT AT [facility])  TROPONIN I (HIGH SENSITIVITY)    EKG EKG Interpretation  Date/Time:  Saturday November 19 2018 15:19:17 EDT Ventricular Rate:  87 PR Interval:    QRS Duration: 90 QT Interval:  443 QTC Calculation: 533 R Axis:   -27 Text Interpretation:  Sinus rhythm Abnormal R-wave progression, late transition LVH by voltage Abnormal T, consider ischemia, diffuse leads Prolonged QT interval T waves unchanged from previous Confirmed by Fredia Sorrow [phone]) on 11/19/2018 3:42:56 PM   Radiology Dg Chest Port 1 View  Result Date: 11/19/2018 CLINICAL DATA:  COVID-19 positive, shortness of breath EXAM: PORTABLE CHEST 1 VIEW COMPARISON:  October 28, 2018 FINDINGS: The mediastinal contour is normal. Heart size is  mildly enlarged. Hazy ground-glass opacities identified throughout both lungs. No pleural effusion is noted. The bony structures are stable. IMPRESSION: Hazy ground-glass opacity identified throughout both lungs. This can be seen in viral pneumonia such as COVID-19. Electronically Signed   By: Abelardo Diesel M.D.   On: 11/19/2018 16:09    Procedures Procedures (including critical care time)  CRITICAL CARE Performed by: Fredia Sorrow Total critical care time: 30 minutes Critical care time was exclusive of separately billable procedures and treating other patients. Critical care was necessary to treat or prevent imminent or life-threatening deterioration. Critical care was time spent personally  by me on the following activities: development of treatment plan with patient and/or surrogate as well as nursing, discussions with consultants, evaluation of patient's response to treatment, examination of patient, obtaining history from patient or surrogate, ordering and performing treatments and interventions, ordering and review of laboratory studies, ordering and review of radiographic studies, pulse oximetry and re-evaluation of patient's condition.   Medications Ordered in ED Medications  0.9 %  sodium chloride infusion ( Intravenous New Bag/Given 11/19/18 1628)     Initial Impression / Assessment and Plan / ED Course  I have reviewed the triage vital signs and the nursing notes.  Pertinent labs & imaging results that were available during my care of the patient were reviewed by me and considered in my medical decision making (see chart for details).       Patient tested positive for COVID-19 July 10.  Patient states that for the past 2-3 days she has had increased shortness of breath.  Also with a complaint of some chest discomfort no significant chest pain.  Chest x-ray consistent with an atypical pneumonia probably related to a viral COVID-19 infection.  Patient did have hypoxia.  On high  flow nasal cannula oxygen 6 L patient is satting around 95% however she is still contact make.  Patient is not hypotensive.  Lactic acid is normal.  Renal function consistent with acute kidney injury.  Potassium level on the low side at 3.0.  But since BUN and creatinine is up on that supplement.  Have not started any IV antibiotics.  Will discuss with the admitting team.  Also have not retested for COVID-19 since she had a positive test on July 10.  Feel that this is all related to complications from COVID-19 infection.  Patient's troponin was markedly elevated.  D-dimer is pending.  EKG without any acute STEMI type changes.  But did show some T wave inversion.  Patient had some of that inferiorly before but now is showing that out in the lateral leads. Also discussed this with the admitting team.   Final Clinical Impressions(s) / ED Diagnoses   Final diagnoses:  Atypical pneumonia  COVID-19 virus infection  Hypoxia  AKI (acute kidney injury) [facility](HCC)    ED Discharge Orders    None       Vanetta MuldersZackowski, Shenetta Schnackenberg, MD 11/19/18 1750

## 2018-11-20 ENCOUNTER — Inpatient Hospital Stay (HOSPITAL_COMMUNITY): Payer: HRSA Program

## 2018-11-20 ENCOUNTER — Encounter (HOSPITAL_COMMUNITY): Payer: Self-pay

## 2018-11-20 DIAGNOSIS — R9431 Abnormal electrocardiogram [ECG] [EKG]: Secondary | ICD-10-CM

## 2018-11-20 DIAGNOSIS — J9601 Acute respiratory failure with hypoxia: Secondary | ICD-10-CM

## 2018-11-20 DIAGNOSIS — A419 Sepsis, unspecified organism: Secondary | ICD-10-CM

## 2018-11-20 DIAGNOSIS — I10 Essential (primary) hypertension: Secondary | ICD-10-CM

## 2018-11-20 LAB — CBC WITH DIFFERENTIAL/PLATELET
Abs Immature Granulocytes: 0.91 10*3/uL — ABNORMAL HIGH (ref 0.00–0.07)
Basophils Absolute: 0.1 10*3/uL (ref 0.0–0.1)
Basophils Relative: 1 %
Eosinophils Absolute: 0 10*3/uL (ref 0.0–0.5)
Eosinophils Relative: 0 %
HCT: 36.8 % (ref 36.0–46.0)
Hemoglobin: 12 g/dL (ref 12.0–15.0)
Immature Granulocytes: 6 %
Lymphocytes Relative: 9 %
Lymphs Abs: 1.3 10*3/uL (ref 0.7–4.0)
MCH: 29.6 pg (ref 26.0–34.0)
MCHC: 32.6 g/dL (ref 30.0–36.0)
MCV: 90.9 fL (ref 80.0–100.0)
Monocytes Absolute: 0.2 10*3/uL (ref 0.1–1.0)
Monocytes Relative: 2 %
Neutro Abs: 11.6 10*3/uL — ABNORMAL HIGH (ref 1.7–7.7)
Neutrophils Relative %: 82 %
Platelets: 375 10*3/uL (ref 150–400)
RBC: 4.05 MIL/uL (ref 3.87–5.11)
RDW: 13 % (ref 11.5–15.5)
WBC: 14.1 10*3/uL — ABNORMAL HIGH (ref 4.0–10.5)
nRBC: 0.9 % — ABNORMAL HIGH (ref 0.0–0.2)

## 2018-11-20 LAB — BASIC METABOLIC PANEL
Anion gap: 11 (ref 5–15)
BUN: 47 mg/dL — ABNORMAL HIGH (ref 8–23)
CO2: 19 mmol/L — ABNORMAL LOW (ref 22–32)
Calcium: 7.6 mg/dL — ABNORMAL LOW (ref 8.9–10.3)
Chloride: 107 mmol/L (ref 98–111)
Creatinine, Ser: 1.54 mg/dL — ABNORMAL HIGH (ref 0.44–1.00)
GFR calc Af Amer: 41 mL/min — ABNORMAL LOW (ref >60)
GFR calc non Af Amer: 35 mL/min — ABNORMAL LOW (ref >60)
Glucose, Bld: 217 mg/dL — ABNORMAL HIGH (ref 70–99)
Potassium: 4.2 mmol/L (ref 3.5–5.1)
Sodium: 137 mmol/L (ref 135–145)

## 2018-11-20 LAB — C-REACTIVE PROTEIN: CRP: 16.3 mg/dL — ABNORMAL HIGH (ref <1.0)

## 2018-11-20 LAB — FERRITIN: Ferritin: >1500 ng/mL — ABNORMAL HIGH (ref 11–307)

## 2018-11-20 LAB — LACTATE DEHYDROGENASE: LDH: 782 U/L — ABNORMAL HIGH (ref 98–192)

## 2018-11-20 LAB — HIV ANTIBODY (ROUTINE TESTING W REFLEX): HIV Screen 4th Generation wRfx: NONREACTIVE

## 2018-11-20 LAB — D-DIMER, QUANTITATIVE: D-Dimer, Quant: >20 ug/mL-FEU — ABNORMAL HIGH (ref 0.00–0.50)

## 2018-11-20 LAB — PROCALCITONIN: Procalcitonin: 2.11 ng/mL

## 2018-11-20 MED ORDER — PANTOPRAZOLE SODIUM 40 MG PO TBEC
40.0000 mg | DELAYED_RELEASE_TABLET | Freq: Every day | ORAL | Status: DC
  Administered 2018-11-20 – 2018-11-22 (×3): 40 mg via ORAL
  Filled 2018-11-20 (×3): qty 1

## 2018-11-20 MED ORDER — SODIUM CHLORIDE 0.9 % IV BOLUS
500.0000 mL | Freq: Once | INTRAVENOUS | Status: AC
  Administered 2018-11-20: 500 mL via INTRAVENOUS

## 2018-11-20 MED ORDER — SODIUM CHLORIDE 0.9 % IV SOLN
2.0000 g | INTRAVENOUS | Status: DC
  Administered 2018-11-20 – 2018-11-22 (×3): 2 g via INTRAVENOUS
  Filled 2018-11-20 (×3): qty 20

## 2018-11-20 MED ORDER — POTASSIUM CHLORIDE CRYS ER 20 MEQ PO TBCR
40.0000 meq | EXTENDED_RELEASE_TABLET | Freq: Three times a day (TID) | ORAL | Status: AC
  Administered 2018-11-20 (×3): 40 meq via ORAL
  Filled 2018-11-20 (×3): qty 2

## 2018-11-20 MED ORDER — MAGNESIUM OXIDE 400 (241.3 MG) MG PO TABS
400.0000 mg | ORAL_TABLET | Freq: Two times a day (BID) | ORAL | Status: AC
  Administered 2018-11-20 (×2): 400 mg via ORAL
  Filled 2018-11-20 (×2): qty 1

## 2018-11-20 MED ORDER — SODIUM CHLORIDE 0.9 % IV SOLN
500.0000 mg | INTRAVENOUS | Status: DC
  Administered 2018-11-20 – 2018-11-22 (×3): 500 mg via INTRAVENOUS
  Filled 2018-11-20 (×3): qty 500

## 2018-11-20 NOTE — Progress Notes (Signed)
Attempted to call with updates, patient's spouse Valarie Merino at home, [phone].  Nursing unable to leave voicemail as recorded message said, "voicemail box has not been set up yet."

## 2018-11-20 NOTE — Progress Notes (Signed)
[facility] PROGRESS NOTE    Progress Note  Marie Curtis  WUJ:[id number] DOB: 10/20/1955 DOA: 11/19/2018 PCP: System, Provider Not In     Brief Narrative:   Marie Curtis is an 63 y.o. female past medical history essential hypertension who was seen for an upper respiratory tract infection in the ED,  who tested positive on 10/28/2018 and as her saturations were stable she was discharged home, for the next 2 to 3 days she had increased shortness of breath she called EMS who found her satting 70% on room air was brought into the ED placed on 6 L nasal cannula improved to above 94.  Significant elevation in her inflammatory markers she is found to have acute renal failure with mild leukocytosis of 14,000.  Her primary language is [place] Recruitment consultant([place])  Assessment/Plan:   Acute respiratory failure with hypoxia of unclear etiology/sepsis due to questionable community-acquired pneumonia: Tested positive for SARS-CoV-2 on 10/28/2018 it is unlikely that at this point she still has COVID-19 infection. 4 L of nasal cannula to keep saturations above 96%.  She has a significant leukocytosis, will start empirically on IV Rocephin and azithromycin. She is not tachycardic, her d-dimer was significantly elevated and she is in acute renal failure, cytosis and an elevated procalcitonin which could all be due to bacterial infection. We will discontinue dexamethasone.  AKI (acute kidney injury) (HCC): Likely prerenal azotemia in the setting of an infection, unknown baseline creatinine. We will continue IV fluids recheck a basic metabolic panel in the morning.  Prolonged QT interval: Her potassium 3.0 replete check magnesium and replete orally.  Essential hypertension: Her blood pressure is stable.  Continue to hold antihypertensive medication   DVT prophylaxis: lovenox Family Communication:none Disposition Plan/Barrier to D/C: once of oxygen Code Status:     Code Status  Orders  (From admission, onward)         Start     Ordered   11/19/18 1827  Full code  Continuous     11/19/18 1838        Code Status History    Date Active Date Inactive Code Status Order ID Comments User Context   05/06/2012 1811 05/07/2012 2155 Full Code [id number]  Axel Filleramirez, Armando, MD Inpatient   Advance Care Planning Activity        IV Access:    Peripheral IV   Procedures and diagnostic studies:   Dg Chest Port 1 View  Result Date: 11/19/2018 CLINICAL DATA:  COVID-19 positive, shortness of breath EXAM: PORTABLE CHEST 1 VIEW COMPARISON:  October 28, 2018 FINDINGS: The mediastinal contour is normal. Heart size is mildly enlarged. Hazy ground-glass opacities identified throughout both lungs. No pleural effusion is noted. The bony structures are stable. IMPRESSION: Hazy ground-glass opacity identified throughout both lungs. This can be seen in viral pneumonia such as COVID-19. Electronically Signed   By: Sherian ReinWei-Chen  Lin M.D.   On: 11/19/2018 16:09     Medical Consultants:    None.  Anti-Infectives:   IV Rocephin and azithromycin  Subjective:    Marie Curtis she relates she still feels short of breath with persistent cough feels tired and fatigued.  Objective:    Vitals:   11/19/18 2232 11/19/18 2245 11/19/18 2325 11/20/18 0425  BP: 109/63 115/70 115/62 103/60  Pulse:  92    Resp:  (!) 34    Temp:    98.9 F (37.2 C)  TempSrc:    Oral  SpO2:  93%    Weight:  Height:       SpO2: 93 % O2 Flow Rate (L/min): 4 L/min(decreased to 4)   Intake/Output Summary (Last 24 hours) at 11/20/2018 0735 Last data filed at 11/20/2018 0400 Gross per 24 hour  Intake 915.13 ml  Output 550 ml  Net 365.13 ml   Filed Weights   11/19/18 1943  Weight: 80.7 kg    Exam: General exam: In no acute distress. Respiratory system: Good air movement and diffuse crackles bilaterally Cardiovascular system: S1 & S2 heard, RRR. No JVD. Gastrointestinal system: Abdomen  is nondistended, soft and nontender.  Central nervous system: Alert and oriented. No focal neurological deficits. Extremities: No pedal edema. Skin: No rashes, lesions or ulcers Psychiatry: Judgement and insight appear normal. Mood & affect appropriate.   Data Reviewed:    Labs: Basic Metabolic Panel: Recent Labs  Lab 11/19/18 1629  NA 133*  K 3.0*  CL 103  CO2 19*  GLUCOSE 186*  BUN 54*  CREATININE 2.17*  CALCIUM 7.4*   GFR Estimated Creatinine Clearance: 26.9 mL/min (A) (by C-G formula based on SCr of 2.17 mg/dL (H)). Liver Function Tests: Recent Labs  Lab 11/19/18 1629  AST 321*  ALT 1,035*  ALKPHOS 80  BILITOT 0.9  PROT 7.1  ALBUMIN 2.3*   No results for input(s): LIPASE, AMYLASE in the last 168 hours. No results for input(s): AMMONIA in the last 168 hours. Coagulation profile No results for input(s): INR, PROTIME in the last 168 hours. COVID-19 Labs  Recent Labs    11/19/18 1629  DDIMER >20.00*    Lab Results  Component Value Date   SARSCOV2NAA DETECTED (A) 10/28/2018    CBC: Recent Labs  Lab 11/19/18 1629  WBC 14.3*  NEUTROABS 11.5*  HGB 12.7  HCT 37.8  MCV 90.0  PLT 383   Cardiac Enzymes: No results for input(s): CKTOTAL, CKMB, CKMBINDEX, TROPONINI in the last 168 hours. BNP (last 3 results) No results for input(s): PROBNP in the last 8760 hours. CBG: No results for input(s): GLUCAP in the last 168 hours. D-Dimer: Recent Labs    11/19/18 1629  DDIMER >20.00*   Hgb A1c: No results for input(s): HGBA1C in the last 72 hours. Lipid Profile: No results for input(s): CHOL, HDL, LDLCALC, TRIG, CHOLHDL, LDLDIRECT in the last 72 hours. Thyroid function studies: No results for input(s): TSH, T4TOTAL, T3FREE, THYROIDAB in the last 72 hours.  Invalid input(s): FREET3 Anemia work up: No results for input(s): VITAMINB12, FOLATE, FERRITIN, TIBC, IRON, RETICCTPCT in the last 72 hours. Sepsis Labs: Recent Labs  Lab 11/19/18 1629  WBC  14.3*  LATICACIDVEN 1.5   Microbiology Recent Results (from the past 240 hour(s))  Culture, blood (Routine X 2) w Reflex to ID Panel     Status: None (Preliminary result)   Collection Time: 11/19/18  3:35 PM   Specimen: BLOOD  Result Value Ref Range Status   Specimen Description BLOOD BLOOD RIGHT HAND  Final   Special Requests   Final    BOTTLES DRAWN AEROBIC AND ANAEROBIC Blood Culture results may not be optimal due to an excessive volume of blood received in culture bottles   Culture   Final    NO GROWTH < 24 HOURS Performed at [facility], [address] [place], [place] [address]    Report Status PENDING  Incomplete  Culture, blood (Routine X 2) w Reflex to ID Panel     Status: None (Preliminary result)   Collection Time: 11/19/18  4:31 PM  Specimen: BLOOD  Result Value Ref Range Status   Specimen Description BLOOD RIGHT ANTECUBITAL  Final   Special Requests   Final    BOTTLES DRAWN AEROBIC AND ANAEROBIC Blood Culture results may not be optimal due to an excessive volume of blood received in culture bottles   Culture   Final    NO GROWTH < 24 HOURS Performed at [facility] [address], [place],  [address]    Report Status PENDING  Incomplete     Medications:   . dexamethasone  6 mg Oral Daily  . docusate sodium  100 mg Oral BID  . enoxaparin (LOVENOX) injection  30 mg Subcutaneous Daily  . metoprolol tartrate  25 mg Oral BID  . pantoprazole (PROTONIX) IV  40 mg Intravenous QHS   Continuous Infusions: . sodium chloride Stopped (11/20/18 0043)  . sodium chloride       LOS: 1 day   Charlynne Cousins  [facility]  11/20/2018, 7:35 AM

## 2018-11-20 NOTE — Progress Notes (Signed)
Initial Nutrition Assessment  RD working remotely.   DOCUMENTATION CODES:   Obesity unspecified  INTERVENTION:  - continue Hormel Shake once/day (500 kcal, 22 grams protein) and Magic Cup BID (290 kcal, 9 grams of protein each) per [facility] ONS protocol. - continue to encourage PO intakes.    NUTRITION DIAGNOSIS:   Increased nutrient needs related to acute illness(COVID-19) as evidenced by estimated needs.  GOAL:   Patient will meet greater than or equal to 90% of their needs  MONITOR:   PO intake, Supplement acceptance, Labs, Weight trends  REASON FOR ASSESSMENT:   Consult Assessment of nutrition requirement/status  ASSESSMENT:   63 y.o. female past medical history essential HTN who was seen for an upper respiratory tract infection in the ED and subsequently tested positive for COVID-19 on 7/10. Her O2 sats were stable at that time so she was discharged home. At home she experienced worsening shortness of breath and called EMS. When EMS arrived, she was sating 70% on room air. On arrival, she was found to have ARF and mild leukocytosis. Her primary language is [place] Medical illustrator).  Patient is Spanish speaking. Per chart review, she consumed 75% of breakfast this AM. Her current weight is 178 lb and PTA the most recently recorded weight was on 05/24/12 when she weighed 180 lb.   Per notes: - sepsis d/t questionable CAP - AKI - it is thought unlikely that she still has COVID-19 d/t testing positive on 7/10 - plan is to d/c home once off of O2   Labs reviewed; BUN: 47 mg/dl, creatinine: 1.54 mg/dl, Ca: 7.6 mg/dl, GFR: 35 ml/min.  Medications reviewed; 100 mg colace BID, 400 mg mag-ox BID, 40 mg IV protonix/day, 20 mEq KDur x1 dose 8/1, 40 mEq KDur x3 doses 8/2. IVF; NS @ 75 ml/hr.     NUTRITION - FOCUSED PHYSICAL EXAM:  unable to complete while patient is at [facility].  Diet Order:   Diet Order            Diet regular Room service appropriate? Yes; Fluid consistency: Thin   Diet effective now              EDUCATION NEEDS:   No education needs have been identified at this time  Skin:  Skin Assessment: Reviewed RN Assessment  Last BM:  8/1  Height:   Ht Readings from Last 1 Encounters:  11/19/18 5\' 4"  (1.626 m)    Weight:   Wt Readings from Last 1 Encounters:  11/19/18 80.7 kg    Ideal Body Weight:  54.54 kg  BMI:  Body mass index is 30.55 kg/m.  Estimated Nutritional Needs:   Kcal:  2100-2340 kcal  Protein:  105-115 grams  Fluid:  >/= 2 L/day     Jarome Matin, MS, RD, LDN, [facility] Inpatient Clinical Dietitian Pager # [phone] After hours/weekend pager # [phone]

## 2018-11-20 NOTE — Progress Notes (Signed)
Pt arrived to unit, via [facility].  Alert and oriented oxygen sats 92 via HFNC 6L. Video interpreter for spanish advised patient having difficultly understanding spanish, stated patient said she spoke [place], Probation officer call telephone interpreter. no interpreter for [place]. Writer called spouse Kennis Carina [phone], who answer admission questions, and interpreted plan care to patient. MD notified of

## 2018-11-21 ENCOUNTER — Inpatient Hospital Stay (HOSPITAL_COMMUNITY): Payer: HRSA Program

## 2018-11-21 DIAGNOSIS — E872 Acidosis, unspecified: Secondary | ICD-10-CM

## 2018-11-21 DIAGNOSIS — R7989 Other specified abnormal findings of blood chemistry: Secondary | ICD-10-CM

## 2018-11-21 DIAGNOSIS — R9431 Abnormal electrocardiogram [ECG] [EKG]: Secondary | ICD-10-CM

## 2018-11-21 LAB — LACTATE DEHYDROGENASE: LDH: 412 U/L — ABNORMAL HIGH (ref 98–192)

## 2018-11-21 LAB — HEPATIC FUNCTION PANEL
ALT: 506 U/L — ABNORMAL HIGH (ref 0–44)
AST: 65 U/L — ABNORMAL HIGH (ref 15–41)
Albumin: 2.3 g/dL — ABNORMAL LOW (ref 3.5–5.0)
Alkaline Phosphatase: 80 U/L (ref 38–126)
Bilirubin, Direct: 0.1 mg/dL (ref 0.0–0.2)
Indirect Bilirubin: 0.4 mg/dL (ref 0.3–0.9)
Total Bilirubin: 0.5 mg/dL (ref 0.3–1.2)
Total Protein: 6.4 g/dL — ABNORMAL LOW (ref 6.5–8.1)

## 2018-11-21 LAB — COMPREHENSIVE METABOLIC PANEL
ALT: 506 U/L — ABNORMAL HIGH (ref 0–44)
AST: 66 U/L — ABNORMAL HIGH (ref 15–41)
Albumin: 2.3 g/dL — ABNORMAL LOW (ref 3.5–5.0)
Alkaline Phosphatase: 77 U/L (ref 38–126)
Anion gap: 9 (ref 5–15)
BUN: 48 mg/dL — ABNORMAL HIGH (ref 8–23)
CO2: 18 mmol/L — ABNORMAL LOW (ref 22–32)
Calcium: 7.7 mg/dL — ABNORMAL LOW (ref 8.9–10.3)
Chloride: 114 mmol/L — ABNORMAL HIGH (ref 98–111)
Creatinine, Ser: 1.37 mg/dL — ABNORMAL HIGH (ref 0.44–1.00)
GFR calc Af Amer: 47 mL/min — ABNORMAL LOW (ref >60)
GFR calc non Af Amer: 41 mL/min — ABNORMAL LOW (ref >60)
Glucose, Bld: 180 mg/dL — ABNORMAL HIGH (ref 70–99)
Potassium: 4.8 mmol/L (ref 3.5–5.1)
Sodium: 141 mmol/L (ref 135–145)
Total Bilirubin: 0.7 mg/dL (ref 0.3–1.2)
Total Protein: 6.5 g/dL (ref 6.5–8.1)

## 2018-11-21 LAB — CBC WITH DIFFERENTIAL/PLATELET
Abs Immature Granulocytes: 0.96 10*3/uL — ABNORMAL HIGH (ref 0.00–0.07)
Basophils Absolute: 0.1 10*3/uL (ref 0.0–0.1)
Basophils Relative: 0 %
Eosinophils Absolute: 0 10*3/uL (ref 0.0–0.5)
Eosinophils Relative: 0 %
HCT: 34.4 % — ABNORMAL LOW (ref 36.0–46.0)
Hemoglobin: 11.2 g/dL — ABNORMAL LOW (ref 12.0–15.0)
Immature Granulocytes: 5 %
Lymphocytes Relative: 8 %
Lymphs Abs: 1.4 10*3/uL (ref 0.7–4.0)
MCH: 30.3 pg (ref 26.0–34.0)
MCHC: 32.6 g/dL (ref 30.0–36.0)
MCV: 93 fL (ref 80.0–100.0)
Monocytes Absolute: 0.7 10*3/uL (ref 0.1–1.0)
Monocytes Relative: 4 %
Neutro Abs: 14.8 10*3/uL — ABNORMAL HIGH (ref 1.7–7.7)
Neutrophils Relative %: 83 %
Platelets: 343 10*3/uL (ref 150–400)
RBC: 3.7 MIL/uL — ABNORMAL LOW (ref 3.87–5.11)
RDW: 13.2 % (ref 11.5–15.5)
WBC: 18 10*3/uL — ABNORMAL HIGH (ref 4.0–10.5)
nRBC: 0.5 % — ABNORMAL HIGH (ref 0.0–0.2)

## 2018-11-21 LAB — FERRITIN: Ferritin: 4360 ng/mL — ABNORMAL HIGH (ref 11–307)

## 2018-11-21 LAB — D-DIMER, QUANTITATIVE: D-Dimer, Quant: >20 ug/mL-FEU — ABNORMAL HIGH (ref 0.00–0.50)

## 2018-11-21 LAB — PROCALCITONIN: Procalcitonin: 1.3 ng/mL

## 2018-11-21 LAB — C-REACTIVE PROTEIN: CRP: 9.4 mg/dL — ABNORMAL HIGH (ref <1.0)

## 2018-11-21 MED ORDER — ENOXAPARIN SODIUM 40 MG/0.4ML ~~LOC~~ SOLN
40.0000 mg | SUBCUTANEOUS | Status: DC
  Administered 2018-11-21 – 2018-11-22 (×2): 40 mg via SUBCUTANEOUS
  Filled 2018-11-21 (×2): qty 0.4

## 2018-11-21 MED ORDER — SODIUM CHLORIDE 0.45 % IV SOLN
INTRAVENOUS | Status: AC
  Administered 2018-11-21: 11:00:00 via INTRAVENOUS

## 2018-11-21 NOTE — Progress Notes (Signed)
VASCULAR LAB PRELIMINARY  PRELIMINARY  PRELIMINARY  PRELIMINARY  Bilateral lower extremity venous  completed.    Preliminary report:  See CV proc for preliminary results.   Gurfateh Mcclain, RVT 11/21/2018, 4:05 PM

## 2018-11-21 NOTE — Progress Notes (Signed)
[facility] PROGRESS NOTE    Progress Note  Amarra Sawyer  ZOX:[id number] DOB: [birth year] DOA: 11/19/2018 PCP: System, Provider Not In     Brief Narrative:   Teresea Donley is an 63 y.o. female past medical history essential hypertension who was seen for an upper respiratory tract infection in the ED,  who tested positive on 10/28/2018 and as her saturations were stable she was discharged home, for the next 2 to 3 days she had increased shortness of breath she called EMS who found her satting 70% on room air was brought into the ED placed on 6 L nasal cannula improved to above 94.  Significant elevation in her inflammatory markers she is found to have acute renal failure with mild leukocytosis of 14,000.  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  Assessment/Plan:   Acute respiratory failure with hypoxia of unclear etiology/sepsis due to questionable community-acquired pneumonia: She tested positive for SARS-CoV-2 on 10/28/2018 it is unlikely that at this point she still have an active COVID-19 infection. On admission she needed 4 L of nasal cannula to keep saturations above 92%, her leukocytosis is worsening (question due to Steroids), she was started on IV Rocephin and azithromycin on 11/20/2018.  Now she is only requiring 1 L of nasal cannula to keep saturations above 92%. The increasing leukocytosis is likely due to the steroids which were [place] on 11/20/2018. Continue IV empiric antibiotics for community-acquired pneumonia. Significant increase in d-dimer of greater than 20, in the setting of possible community-acquired pneumonia, will check lower extremity Dopplers as her legs seem asymmetric.  Asymmetric leg swelling: No edema on physical exam her d-dimer is greater than 20 check lower extremity Doppler.  Increase her Lovenox to subtherapeutic dose.  AKI (acute kidney injury) ([place]): Likely  prerenal azotemia in the setting of an infection, unknown baseline creatinine. We will continue IV fluids recheck a basic metabolic panel in the morning.  Prolonged QT interval: Her potassium 3.0 replete check magnesium and replete orally.  Essential hypertension: Blood pressure is currently rising continue to monitor closely. Continue to hold antihypertensive medication.  Elevated LFT's: Phosphatase and bilirubin are below the threshold. Her ALT is twice as much as her AST, she denies any abdominal pain nausea or vomiting.  Check an abdominal ultrasound.  Non-anion gap metabolic acidosis: Likely due to acute renal failure improving.   DVT prophylaxis: lovenox Family Communication:none Disposition Plan/Barrier to D/C: once of oxygen Code Status:     Code Status Orders  (From admission, onward)         Start     Ordered   11/19/18 1827  Full code  Continuous     11/19/18 1838        Code Status History    Date Active Date Inactive Code Status Order ID Comments User Context   05/06/2012 1811 05/07/2012 2155 Full Code [id number]  Ralene Ok, MD Inpatient   Advance Care Planning Activity        IV Access:    Peripheral IV   Procedures and diagnostic studies:   Portable Chest 1 View  Result Date: 11/20/2018 CLINICAL DATA:  63 year old female COVID-19. EXAM: PORTABLE CHEST 1 VIEW COMPARISON:  11/19/2018 and earlier. FINDINGS: AP view at 0555 hours. Coarse bilateral pulmonary interstitial opacity appears mildly increased in both lungs. Stable lung volumes. Stable cardiac size and mediastinal contours. Visualized tracheal air column is within normal limits. No pneumothorax or pleural effusion. No air bronchograms identified.  Negative visible bowel gas pattern. No acute osseous abnormality identified. IMPRESSION: Mild progression of coarse bilateral pulmonary interstitial opacity in both lungs since yesterday compatible with COVID-19 pneumonia. No pleural effusion.  Electronically Signed   By: Odessa FlemingH  Hall M.D.   On: 11/20/2018 08:56   Dg Chest Port 1 View  Result Date: 11/19/2018 CLINICAL DATA:  COVID-19 positive, shortness of breath EXAM: PORTABLE CHEST 1 VIEW COMPARISON:  October 28, 2018 FINDINGS: The mediastinal contour is normal. Heart size is mildly enlarged. Hazy ground-glass opacities identified throughout both lungs. No pleural effusion is noted. The bony structures are stable. IMPRESSION: Hazy ground-glass opacity identified throughout both lungs. This can be seen in viral pneumonia such as COVID-19. Electronically Signed   By: Sherian ReinWei-Chen  Lin M.D.   On: 11/19/2018 16:09     Medical Consultants:    None.  Anti-Infectives:   IV Rocephin and azithromycin  Subjective:    Khilynn Roselyn ReefGonzalez Baltazar relates she feels her shortness of breath is better than yesterday.  Objective:    Vitals:   11/21/18 0400 11/21/18 0417 11/21/18 0500 11/21/18 0600  BP:      Pulse: 65  66 67  Resp: (!) 23  (!) 26 20  Temp:  97.7 F (36.5 C)    TempSrc:  Oral    SpO2: 99%  99% (!) 88%  Weight:   79.5 kg   Height:       SpO2: (!) 88 %(pt ambulating to standing scale) O2 Flow Rate (L/min): 1 L/min   Intake/Output Summary (Last 24 hours) at 11/21/2018 0730 Last data filed at 11/21/2018 0400 Gross per 24 hour  Intake 3223.9 ml  Output --  Net 3223.9 ml   Filed Weights   11/19/18 1943 11/21/18 0500  Weight: 80.7 kg 79.5 kg    Exam: General exam: In no acute distress. Respiratory system: Good air movement and clear to auscultation. Cardiovascular system: S1 & S2 heard, RRR. No JVD. Gastrointestinal system: Abdomen is nondistended, soft and nontender.  Central nervous system: Alert and oriented. No focal neurological deficits. Extremities: No swelling or edema, but her lower extremities due to the asymmetric Skin: No rashes, lesions or ulcers Psychiatry: Judgement and insight appear normal. Mood & affect appropriate.    Data Reviewed:     Labs: Basic Metabolic Panel: Recent Labs  Lab 11/19/18 1629 11/20/18 0800 11/21/18 0300  NA 133* 137 141  K 3.0* 4.2 4.8  CL 103 107 114*  CO2 19* 19* 18*  GLUCOSE 186* 217* 180*  BUN 54* 47* 48*  CREATININE 2.17* 1.54* 1.37*  CALCIUM 7.4* 7.6* 7.7*   GFR Estimated Creatinine Clearance: 42.4 mL/min (A) (by C-G formula based on SCr of 1.37 mg/dL (H)). Liver Function Tests: Recent Labs  Lab 11/19/18 1629 11/21/18 0300  AST 321* 66*  ALT 1,035* 506*  ALKPHOS 80 77  BILITOT 0.9 0.7  PROT 7.1 6.5  ALBUMIN 2.3* 2.3*   No results for input(s): LIPASE, AMYLASE in the last 168 hours. No results for input(s): AMMONIA in the last 168 hours. Coagulation profile No results for input(s): INR, PROTIME in the last 168 hours. COVID-19 Labs  Recent Labs    11/19/18 1629 11/20/18 0800 11/21/18 0300  DDIMER >20.00* >20.00* >20.00*  FERRITIN  --  >1,500* 4,360*  LDH  --  782* 412*  CRP  --  16.3* 9.4*    Lab Results  Component Value Date   SARSCOV2NAA DETECTED (A) 10/28/2018    CBC: Recent Labs  Lab 11/19/18 1629  11/20/18 0800 11/21/18 0300  WBC 14.3* 14.1* 18.0*  NEUTROABS 11.5* 11.6* 14.8*  HGB 12.7 12.0 11.2*  HCT 37.8 36.8 34.4*  MCV 90.0 90.9 93.0  PLT 383 375 343   Cardiac Enzymes: No results for input(s): CKTOTAL, CKMB, CKMBINDEX, TROPONINI in the last 168 hours. BNP (last 3 results) No results for input(s): PROBNP in the last 8760 hours. CBG: No results for input(s): GLUCAP in the last 168 hours. D-Dimer: Recent Labs    11/20/18 0800 11/21/18 0300  DDIMER >20.00* >20.00*   Hgb A1c: No results for input(s): HGBA1C in the last 72 hours. Lipid Profile: No results for input(s): CHOL, HDL, LDLCALC, TRIG, CHOLHDL, LDLDIRECT in the last 72 hours. Thyroid function studies: No results for input(s): TSH, T4TOTAL, T3FREE, THYROIDAB in the last 72 hours.  Invalid input(s): FREET3 Anemia work up: Recent Labs    11/20/18 0800 11/21/18 0300   FERRITIN >1,500* 4,360*   Sepsis Labs: Recent Labs  Lab 11/19/18 1629 11/20/18 0800 11/21/18 0300  PROCALCITON  --  2.11 1.30  WBC 14.3* 14.1* 18.0*  LATICACIDVEN 1.5  --   --    Microbiology Recent Results (from the past 240 hour(s))  Culture, blood (Routine X 2) w Reflex to ID Panel     Status: None (Preliminary result)   Collection Time: 11/19/18  3:35 PM   Specimen: BLOOD RIGHT HAND  Result Value Ref Range Status   Specimen Description BLOOD RIGHT HAND  Final   Special Requests   Final    BOTTLES DRAWN AEROBIC AND ANAEROBIC Blood Culture results may not be optimal due to an excessive volume of blood received in culture bottles   Culture   Final    NO GROWTH < 24 HOURS Performed at [facility], [address] [place], [place] [address]    Report Status PENDING  Incomplete  Culture, blood (Routine X 2) w Reflex to ID Panel     Status: None (Preliminary result)   Collection Time: 11/19/18  4:31 PM   Specimen: BLOOD  Result Value Ref Range Status   Specimen Description BLOOD RIGHT ANTECUBITAL  Final   Special Requests   Final    BOTTLES DRAWN AEROBIC AND ANAEROBIC Blood Culture results may not be optimal due to an excessive volume of blood received in culture bottles   Culture   Final    NO GROWTH < 24 HOURS Performed at [facility], [address] [place], [place] [address]    Report Status PENDING  Incomplete     Medications:    docusate sodium  100 mg Oral BID   metoprolol tartrate  25 mg Oral BID   pantoprazole  40 mg Oral Daily   Continuous Infusions:  sodium chloride 75 mL/hr at 11/21/18 0543   azithromycin Stopped (11/20/18 1200)   cefTRIAXone (ROCEPHIN)  IV Stopped (11/20/18 1045)     LOS: 2 days   Marinda ElkAbraham Feliz Ortiz  [facility]  11/21/2018, 7:30 AM

## 2018-11-22 DIAGNOSIS — J189 Pneumonia, unspecified organism: Secondary | ICD-10-CM

## 2018-11-22 DIAGNOSIS — R6520 Severe sepsis without septic shock: Secondary | ICD-10-CM

## 2018-11-22 DIAGNOSIS — I10 Essential (primary) hypertension: Secondary | ICD-10-CM

## 2018-11-22 DIAGNOSIS — A419 Sepsis, unspecified organism: Secondary | ICD-10-CM

## 2018-11-22 DIAGNOSIS — R7989 Other specified abnormal findings of blood chemistry: Secondary | ICD-10-CM

## 2018-11-22 DIAGNOSIS — J9601 Acute respiratory failure with hypoxia: Secondary | ICD-10-CM

## 2018-11-22 LAB — CBC WITH DIFFERENTIAL/PLATELET
Abs Immature Granulocytes: 0.63 10*3/uL — ABNORMAL HIGH (ref 0.00–0.07)
Basophils Absolute: 0.1 10*3/uL (ref 0.0–0.1)
Basophils Relative: 1 %
Eosinophils Absolute: 0.7 10*3/uL — ABNORMAL HIGH (ref 0.0–0.5)
Eosinophils Relative: 5 %
HCT: 38.2 % (ref 36.0–46.0)
Hemoglobin: 12.1 g/dL (ref 12.0–15.0)
Immature Granulocytes: 5 %
Lymphocytes Relative: 17 %
Lymphs Abs: 2 10*3/uL (ref 0.7–4.0)
MCH: 30.2 pg (ref 26.0–34.0)
MCHC: 31.7 g/dL (ref 30.0–36.0)
MCV: 95.3 fL (ref 80.0–100.0)
Monocytes Absolute: 0.7 10*3/uL (ref 0.1–1.0)
Monocytes Relative: 6 %
Neutro Abs: 8.1 10*3/uL — ABNORMAL HIGH (ref 1.7–7.7)
Neutrophils Relative %: 66 %
Platelets: ADEQUATE 10*3/uL (ref 150–400)
RBC: 4.01 MIL/uL (ref 3.87–5.11)
RDW: 13.6 % (ref 11.5–15.5)
WBC: 12.2 10*3/uL — ABNORMAL HIGH (ref 4.0–10.5)
nRBC: 0.2 % (ref 0.0–0.2)

## 2018-11-22 LAB — BASIC METABOLIC PANEL
Anion gap: 10 (ref 5–15)
BUN: 29 mg/dL — ABNORMAL HIGH (ref 8–23)
CO2: 18 mmol/L — ABNORMAL LOW (ref 22–32)
Calcium: 7.7 mg/dL — ABNORMAL LOW (ref 8.9–10.3)
Chloride: 113 mmol/L — ABNORMAL HIGH (ref 98–111)
Creatinine, Ser: 1.03 mg/dL — ABNORMAL HIGH (ref 0.44–1.00)
GFR calc Af Amer: >60 mL/min (ref >60)
GFR calc non Af Amer: 57 mL/min — ABNORMAL LOW (ref >60)
Glucose, Bld: 91 mg/dL (ref 70–99)
Potassium: 4.2 mmol/L (ref 3.5–5.1)
Sodium: 141 mmol/L (ref 135–145)

## 2018-11-22 LAB — D-DIMER, QUANTITATIVE: D-Dimer, Quant: 13.71 ug/mL-FEU — ABNORMAL HIGH (ref 0.00–0.50)

## 2018-11-22 LAB — LACTATE DEHYDROGENASE: LDH: 399 U/L — ABNORMAL HIGH (ref 98–192)

## 2018-11-22 LAB — FERRITIN: Ferritin: 2070 ng/mL — ABNORMAL HIGH (ref 11–307)

## 2018-11-22 LAB — C-REACTIVE PROTEIN: CRP: 3.6 mg/dL — ABNORMAL HIGH (ref <1.0)

## 2018-11-22 MED ORDER — AMOXICILLIN-POT CLAVULANATE 875-125 MG PO TABS: 1.0000 | ORAL_TABLET | Freq: Two times a day (BID) | ORAL | 0 refills | Status: AC

## 2018-11-22 MED ORDER — METOPROLOL TARTRATE 25 MG PO TABS: 12.5000 mg | ORAL_TABLET | Freq: Two times a day (BID) | ORAL | 3 refills | Status: DC

## 2018-11-22 MED ORDER — AZITHROMYCIN 500 MG PO TABS: 500.0000 mg | ORAL_TABLET | Freq: Every day | ORAL | 0 refills | Status: AC

## 2018-11-22 NOTE — Discharge Summary (Signed)
Physician Discharge Summary  Marie Curtis ZOX:[id number] DOB: 11/16/1955 DOA: 11/19/2018  PCP: System, Provider Not In  Admit date: 11/19/2018 Discharge date: 11/22/2018  Admitted From: Home Disposition:  Home  Recommendations for Outpatient Follow-up:  1. Follow up with PCP in 1-2 weeks 2. Please obtain BMP/CBC in one week   Home Health:No Equipment/Devices:None  Discharge Condition:stable CODE STATUS:Full Diet recommendation: Heart Healthy  Brief/Interim Summary:  63 y.o. 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 On admission she needed 4 L of nasal cannula to keep her saturations above 92% with worsening leukocytosis. She was started on IV Rocephin and azithromycin on 11/20/2018 2:20 days of IV antibiotics she remained afebrile leukocytosis improved her saturations improved to 100% on room air.   Her d-dimer was significantly elevated. Lower extremity Doppler was done which is negative for DVT.  Acute kidney  injury: Likely prerenal resolved with IV fluid hydration.  Prolonged QT: Likely due to electrolyte imbalances were repleted orally now resolved.  Essential hypertension: Her blood pressure continue to rise she was started on oral metoprolol which she will continue as an outpatient.  Elevated LFT's: Alkaline phosphatase and bilirubin remain below the threshold. Her ALT is twice as elevated as her AST she denies any abdominal pain nausea or vomiting and abdominal ultrasound did not show an acute cholecystitis. Likely due to infectious etiology.   Discharge Instructions  Discharge Instructions    Diet - low sodium heart healthy   Complete by: As directed    Increase activity slowly   Complete by: As directed      Allergies as of 11/22/2018   No Known Allergies     Medication List    STOP taking these medications   acetaminophen 500 MG tablet Commonly known as: TYLENOL     TAKE these medications   amoxicillin-clavulanate 875-125 MG tablet Commonly known as: Augmentin Take 1 tablet by mouth 2 (two) times daily for 4 days.   azithromycin 500 MG tablet Commonly known as: Zithromax Take 1 tablet (500 mg total) by mouth daily for 3 days. Take 1 tablet daily for 3 days.   metoprolol tartrate 25 MG tablet Commonly known as: LOPRESSOR Take 0.5 tablets (12.5 mg total) by mouth 2 (two) times daily.       No Known Allergies  Consultations:  None   Procedures/Studies: Portable Chest 1 View  Result Date: 11/20/2018 CLINICAL DATA:  63 year old female COVID-19. EXAM: PORTABLE CHEST 1 VIEW COMPARISON:  11/19/2018 and earlier. FINDINGS: AP view at 0555 hours. Coarse bilateral pulmonary interstitial  opacity appears mildly increased in both lungs. Stable lung volumes. Stable cardiac size and mediastinal contours. Visualized tracheal air column is within normal limits. No pneumothorax or pleural effusion. No air bronchograms identified. Negative visible bowel gas pattern. No acute  osseous abnormality identified. IMPRESSION: Mild progression of coarse bilateral pulmonary interstitial opacity in both lungs since yesterday compatible with COVID-19 pneumonia. No pleural effusion. Electronically Signed   By: Odessa Fleming M.D.   On: 11/20/2018 08:56   Dg Chest Port 1 View  Result Date: 11/19/2018 CLINICAL DATA:  COVID-19 positive, shortness of breath EXAM: PORTABLE CHEST 1 VIEW COMPARISON:  October 28, 2018 FINDINGS: The mediastinal contour is normal. Heart size is mildly enlarged. Hazy ground-glass opacities identified throughout both lungs. No pleural effusion is noted. The bony structures are stable. IMPRESSION: Hazy ground-glass opacity identified throughout both lungs. This can be seen in viral pneumonia such as COVID-19. Electronically Signed   By: Sherian Rein M.D.   On: 11/19/2018 16:09   Dg Chest Portable 1 View  Result Date: 10/28/2018 CLINICAL DATA:  Cough. EXAM: PORTABLE CHEST 1 VIEW COMPARISON:  May 06, 2012 FINDINGS: The heart size is mildly enlarged. There is some scarring versus atelectasis at the lung bases, right worse than left. There is no pneumothorax. No large pleural effusion. Aortic calcifications are noted. IMPRESSION: No active disease. Electronically Signed   By: Katherine Mantle M.D.   On: 10/28/2018 20:42   Vas [place] Lower Extremity Venous (dvt)  Result Date: 11/21/2018  Lower Venous Study Indications: Covid +, elevated D-dimer.  Comparison Study: No prior study on file for comparison. Performing Technologist: Sherren Kerns RVS  Examination Guidelines: A complete evaluation includes B-mode imaging, spectral Doppler, color Doppler, and power Doppler as needed of all accessible portions of each vessel. Bilateral testing is considered an integral part of a complete examination. Limited examinations for reoccurring indications may be performed as noted.  +---------+---------------+---------+-----------+----------+-------+ RIGHT     CompressibilityPhasicitySpontaneityPropertiesSummary +---------+---------------+---------+-----------+----------+-------+ CFV      Full           Yes      Yes                          +---------+---------------+---------+-----------+----------+-------+ SFJ      Full                                                 +---------+---------------+---------+-----------+----------+-------+ FV Prox  Full                                                 +---------+---------------+---------+-----------+----------+-------+ FV Mid   Full                                                 +---------+---------------+---------+-----------+----------+-------+ FV DistalFull                                                 +---------+---------------+---------+-----------+----------+-------+ PFV  Full                                                 +---------+---------------+---------+-----------+----------+-------+ POP      Full           Yes      Yes                          +---------+---------------+---------+-----------+----------+-------+ PTV      Full                                                 +---------+---------------+---------+-----------+----------+-------+ PERO     Full                                                 +---------+---------------+---------+-----------+----------+-------+   +---------+---------------+---------+-----------+----------+-------+ LEFT     CompressibilityPhasicitySpontaneityPropertiesSummary +---------+---------------+---------+-----------+----------+-------+ CFV      Full           Yes      Yes                          +---------+---------------+---------+-----------+----------+-------+ SFJ      Full                                                 +---------+---------------+---------+-----------+----------+-------+ FV Prox  Full                                                  +---------+---------------+---------+-----------+----------+-------+ FV Mid   Full                                                 +---------+---------------+---------+-----------+----------+-------+ FV DistalFull                                                 +---------+---------------+---------+-----------+----------+-------+ PFV      Full                                                 +---------+---------------+---------+-----------+----------+-------+ POP      Full           Yes      Yes                          +---------+---------------+---------+-----------+----------+-------+ PTV      Full                                                 +---------+---------------+---------+-----------+----------+-------+  PERO     Full                                                 +---------+---------------+---------+-----------+----------+-------+     Summary: Right: There is no evidence of deep vein thrombosis in the lower extremity. Left: there is no evidence of deep vein thrombosis in the lower extremity.  *See table(s) above for measurements and observations.    Preliminary     (Echo, Carotid, EGD, Colonoscopy, ERCP)    Subjective: No new complaints feels great.  Discharge Exam: Vitals:   11/22/18 0355 11/22/18 0825  BP: 109/68 128/66  Pulse: 80 78  Resp: (!) 22   Temp: 97.7 F (36.5 C) 97.8 F (36.6 C)  SpO2: 100% 97%   Vitals:   11/22/18 0004 11/22/18 0355 11/22/18 0500 11/22/18 0825  BP: (!) 142/72 109/68  128/66  Pulse: 69 80  78  Resp: 12 (!) 22    Temp: 97.9 F (36.6 C) 97.7 F (36.5 C)  97.8 F (36.6 C)  TempSrc: Oral Oral  Oral  SpO2: 95% 100%  97%  Weight:   79.9 kg   Height:        General: Pt is alert, awake, not in acute distress Cardiovascular: RRR, S1/S2 +, no rubs, no gallops Respiratory: CTA bilaterally, no wheezing, no rhonchi Abdominal: Soft, NT, ND, bowel sounds + Extremities: no edema, no cyanosis    The results  of significant diagnostics from this hospitalization (including imaging, microbiology, ancillary and laboratory) are listed below for reference.     Microbiology: Recent Results (from the past 240 hour(s))  Culture, blood (Routine X 2) w Reflex to ID Panel     Status: None (Preliminary result)   Collection Time: 11/19/18  3:35 PM   Specimen: BLOOD RIGHT HAND  Result Value Ref Range Status   Specimen Description BLOOD RIGHT HAND  Final   Special Requests   Final    BOTTLES DRAWN AEROBIC AND ANAEROBIC Blood Culture results may not be optimal due to an excessive volume of blood received in culture bottles   Culture   Final    NO GROWTH 3 DAYS Performed at [facility], [address] [place], [place] [address]    Report Status PENDING  Incomplete  Culture, blood (Routine X 2) w Reflex to ID Panel     Status: None (Preliminary result)   Collection Time: 11/19/18  4:31 PM   Specimen: BLOOD  Result Value Ref Range Status   Specimen Description BLOOD RIGHT ANTECUBITAL  Final   Special Requests   Final    BOTTLES DRAWN AEROBIC AND ANAEROBIC Blood Culture results may not be optimal due to an excessive volume of blood received in culture bottles   Culture   Final    NO GROWTH 3 DAYS Performed at [facility], [address] [place], [place] [address]    Report Status PENDING  Incomplete     Labs: BNP (last 3 results) Recent Labs    11/19/18 1629  BNP 280.4*   Basic Metabolic Panel: Recent Labs  Lab 11/19/18 1629 11/20/18 0800 11/21/18 0300 11/22/18 0245  NA 133* 137 141 141  K 3.0* 4.2 4.8 4.2  CL 103 107 114* 113*  CO2 19* 19* 18* 18*  GLUCOSE 186* 217* 180* 91  BUN 54* 47* 48* 29*  CREATININE 2.17* 1.54* 1.37* 1.03*  CALCIUM 7.4* 7.6* 7.7* 7.7*   Liver Function Tests: Recent Labs  Lab 11/19/18 1629 11/21/18 0300  AST 321* 65*  66*  ALT 1,035* 506*  506*  ALKPHOS 80 80  77  BILITOT 0.9 0.5  0.7  PROT 7.1 6.4*  6.5  ALBUMIN 2.3* 2.3*  2.3*    No results for input(s): LIPASE, AMYLASE in the last 168 hours. No results for input(s): AMMONIA in the last 168 hours. CBC: Recent Labs  Lab 11/19/18 1629 11/20/18 0800 11/21/18 0300 11/22/18 0245  WBC 14.3* 14.1* 18.0* 12.2*  NEUTROABS 11.5* 11.6* 14.8* 8.1*  HGB 12.7 12.0 11.2* 12.1  HCT 37.8 36.8 34.4* 38.2  MCV 90.0 90.9 93.0 95.3  PLT 383 375 343 PLATELET CLUMPS NOTED ON SMEAR, COUNT APPEARS ADEQUATE   Cardiac Enzymes: No results for input(s): CKTOTAL, CKMB, CKMBINDEX, TROPONINI in the last 168 hours. BNP: Invalid input(s): POCBNP CBG: No results for input(s): GLUCAP in the last 168 hours. D-Dimer Recent Labs    11/21/18 0300 11/22/18 0245  DDIMER >20.00* 13.71*   Hgb A1c No results for input(s): HGBA1C in the last 72 hours. Lipid Profile No results for input(s): CHOL, HDL, LDLCALC, TRIG, CHOLHDL, LDLDIRECT in the last 72 hours. Thyroid function studies No results for input(s): TSH, T4TOTAL, T3FREE, THYROIDAB in the last 72 hours.  Invalid input(s): FREET3 Anemia work up Recent Labs    11/21/18 0300 11/22/18 0245  FERRITIN 4,360* 2,070*   Urinalysis    Component Value Date/Time   COLORURINE Straw 10/29/2011 1240   APPEARANCEUR Clear 10/29/2011 1240   LABSPEC 1.008 10/29/2011 1240   PHURINE 6.0 10/29/2011 1240   GLUCOSEU Negative 10/29/2011 1240   HGBUR Negative 10/29/2011 1240   BILIRUBINUR Negative 10/29/2011 1240   KETONESUR Negative 10/29/2011 1240   PROTEINUR Negative 10/29/2011 1240   NITRITE Negative 10/29/2011 1240   LEUKOCYTESUR Negative 10/29/2011 1240   Sepsis Labs Invalid input(s): PROCALCITONIN,  WBC,  LACTICIDVEN Microbiology Recent Results (from the past 240 hour(s))  Culture, blood (Routine X 2) w Reflex to ID Panel     Status: None (Preliminary result)   Collection Time: 11/19/18  3:35 PM   Specimen: BLOOD RIGHT HAND  Result Value Ref Range Status   Specimen Description BLOOD RIGHT HAND  Final   Special Requests   Final     BOTTLES DRAWN AEROBIC AND ANAEROBIC Blood Culture results may not be optimal due to an excessive volume of blood received in culture bottles   Culture   Final    NO GROWTH 3 DAYS Performed at [facility], [place] [address], [place], [place] [address]    Report Status PENDING  Incomplete  Culture, blood (Routine X 2) w Reflex to ID Panel     Status: None (Preliminary result)   Collection Time: 11/19/18  4:31 PM   Specimen: BLOOD  Result Value Ref Range Status   Specimen Description BLOOD RIGHT ANTECUBITAL  Final   Special Requests   Final    BOTTLES DRAWN AEROBIC AND ANAEROBIC Blood Culture results may not be optimal due to an excessive volume of blood received in culture bottles   Culture   Final    NO GROWTH 3 DAYS Performed at [facility], [place] [address], [place], [place] [address]    Report Status PENDING  Incomplete     Time coordinating discharge: Over 30 minutes  SIGNED:   Charlynne Cousins, MD  [facility] 11/22/2018, 10:05 AM  Pager   If 7PM-7AM, please contact night-coverage [email or web address] Password [email or web address]

## 2018-11-24 LAB — CULTURE, BLOOD (ROUTINE X 2)
Culture: NO GROWTH
Culture: NO GROWTH

## 2019-06-03 ENCOUNTER — Ambulatory Visit: Payer: Self-pay | Attending: Internal Medicine

## 2019-06-03 DIAGNOSIS — Z23 Encounter for immunization: Secondary | ICD-10-CM | POA: Insufficient documentation

## 2019-06-03 NOTE — Progress Notes (Signed)
   Covid-19 Vaccination Clinic  Name:  Marie Curtis    MRN: [id number] DOB: 10-Jun-1955  06/03/2019  [name] was observed post Covid-19 immunization for 15 minutes without incidence. She was provided with Vaccine Information Sheet and instruction to access the V-Safe system.   [name] was instructed to call 911 with any severe reactions post vaccine: Marland Kitchen Difficulty breathing  . Swelling of your face and throat  . A fast heartbeat  . A bad rash all over your body  . Dizziness and weakness    Immunizations Administered    Name Date Dose VIS Date Route   Pfizer COVID-19 Vaccine 06/03/2019  9:04 AM 0.3 mL 03/31/2019 Intramuscular   Manufacturer: [employer], [employer]   Lot: [id number]   NDC: [address]-0

## 2019-06-24 ENCOUNTER — Ambulatory Visit: Payer: Self-pay | Attending: Internal Medicine

## 2019-06-24 DIAGNOSIS — Z23 Encounter for immunization: Secondary | ICD-10-CM | POA: Insufficient documentation

## 2019-06-24 NOTE — Progress Notes (Signed)
   Covid-19 Vaccination Clinic  Name:  Marie Curtis    MRN: [id number] DOB: Aug 21, 1955  06/24/2019  [name] was observed post Covid-19 immunization for 15 minutes without incident. She was provided with Vaccine Information Sheet and instruction to access the V-Safe system.   [name] was instructed to call 911 with any severe reactions post vaccine: Marland Kitchen Difficulty breathing  . Swelling of face and throat  . A fast heartbeat  . A bad rash all over body  . Dizziness and weakness   Immunizations Administered    Name Date Dose VIS Date Route   Pfizer COVID-19 Vaccine 06/24/2019  8:44 AM 0.3 mL 03/31/2019 Intramuscular   Manufacturer: [employer], [employer]   Lot: [id number]   NDC: [address]-1

## 2020-02-10 IMAGING — DX PORTABLE CHEST - 1 VIEW
1 series · 1 of 1 positions shown · non-contrast
Comparison: 11/19/2018 and earlier.

CLINICAL DATA: 63-year-old male 2NVLM-UL.

EXAM:
PORTABLE CHEST 1 VIEW

[chest]
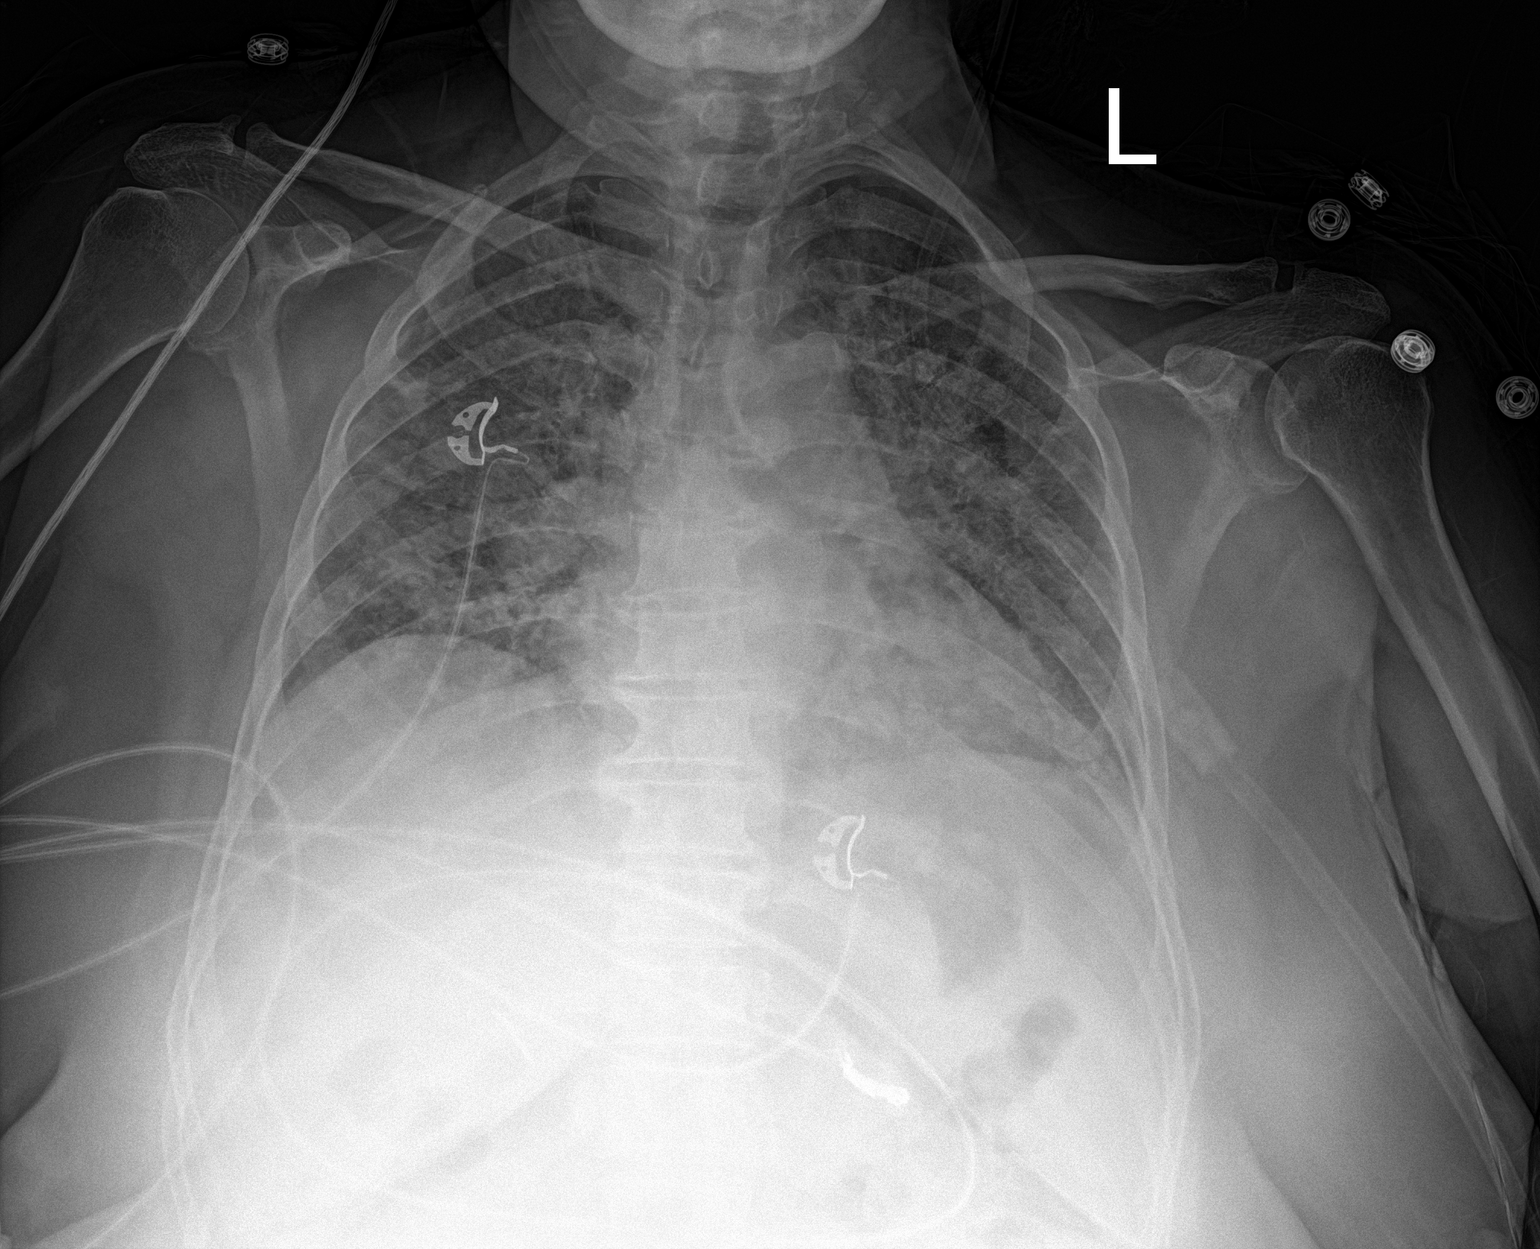

[1 of 1 positions shown; findings below may reference images not displayed]

FINDINGS: AP view at 7999 hours. Coarse bilateral pulmonary interstitial
opacity appears mildly increased in both lungs. Stable lung volumes.
Stable cardiac size and mediastinal contours. Visualized tracheal
air column is within normal limits. No pneumothorax or pleural
effusion. No air bronchograms identified. Negative visible bowel gas
pattern. No acute osseous abnormality identified.
IMPRESSION: Mild progression of coarse bilateral pulmonary interstitial opacity
in both lungs since yesterday compatible with 2NVLM-UL pneumonia. No
pleural effusion.

## 2021-05-16 ENCOUNTER — Other Ambulatory Visit: Payer: Self-pay

## 2021-05-16 ENCOUNTER — Emergency Department
Admission: EM | Admit: 2021-05-16 | Discharge: 2021-05-16 | Disposition: A | Discharge status: Home / Self Care | Payer: Medicare HMO | Attending: Emergency Medicine | Admitting: Emergency Medicine

## 2021-05-16 DIAGNOSIS — R059 Cough, unspecified: Secondary | ICD-10-CM | POA: Insufficient documentation

## 2021-05-16 DIAGNOSIS — E119 Type 2 diabetes mellitus without complications: Secondary | ICD-10-CM | POA: Diagnosis not present

## 2021-05-16 DIAGNOSIS — R531 Weakness: Secondary | ICD-10-CM | POA: Insufficient documentation

## 2021-05-16 DIAGNOSIS — M791 Myalgia, unspecified site: Secondary | ICD-10-CM | POA: Insufficient documentation

## 2021-05-16 DIAGNOSIS — I10 Essential (primary) hypertension: Secondary | ICD-10-CM | POA: Insufficient documentation

## 2021-05-16 DIAGNOSIS — E1165 Type 2 diabetes mellitus with hyperglycemia: Secondary | ICD-10-CM | POA: Diagnosis not present

## 2021-05-16 DIAGNOSIS — R110 Nausea: Secondary | ICD-10-CM | POA: Diagnosis not present

## 2021-05-16 DIAGNOSIS — R350 Frequency of micturition: Secondary | ICD-10-CM | POA: Diagnosis present

## 2021-05-16 DIAGNOSIS — R42 Dizziness and giddiness: Secondary | ICD-10-CM | POA: Insufficient documentation

## 2021-05-16 DIAGNOSIS — Z20822 Contact with and (suspected) exposure to covid-19: Secondary | ICD-10-CM | POA: Insufficient documentation

## 2021-05-16 LAB — URINALYSIS, ROUTINE W REFLEX MICROSCOPIC
Bilirubin Urine: NEGATIVE
Glucose, UA: >1000 mg/dL — AB
Hgb urine dipstick: NEGATIVE
Ketones, ur: 15 mg/dL — AB
Leukocytes,Ua: NEGATIVE
Nitrite: NEGATIVE
Protein, ur: NEGATIVE mg/dL
Specific Gravity, Urine: 1.015 (ref 1.005–1.030)
pH: 5.5 (ref 5.0–8.0)

## 2021-05-16 LAB — COMPREHENSIVE METABOLIC PANEL
ALT: 53 U/L — ABNORMAL HIGH (ref 0–44)
AST: 26 U/L (ref 15–41)
Albumin: 4.3 g/dL (ref 3.5–5.0)
Alkaline Phosphatase: 126 U/L (ref 38–126)
Anion gap: 10 (ref 5–15)
BUN: 22 mg/dL (ref 8–23)
CO2: 24 mmol/L (ref 22–32)
Calcium: 9 mg/dL (ref 8.9–10.3)
Chloride: 102 mmol/L (ref 98–111)
Creatinine, Ser: 0.95 mg/dL (ref 0.44–1.00)
GFR, Estimated: >60 mL/min (ref >60)
Glucose, Bld: 451 mg/dL — ABNORMAL HIGH (ref 70–99)
Potassium: 3.9 mmol/L (ref 3.5–5.1)
Sodium: 136 mmol/L (ref 135–145)
Total Bilirubin: 0.7 mg/dL (ref 0.3–1.2)
Total Protein: 7.9 g/dL (ref 6.5–8.1)

## 2021-05-16 LAB — CBG MONITORING, ED
Glucose-Capillary: 398 mg/dL — ABNORMAL HIGH (ref 70–99)
Glucose-Capillary: 273 mg/dL — ABNORMAL HIGH (ref 70–99)
Glucose-Capillary: 239 mg/dL — ABNORMAL HIGH (ref 70–99)

## 2021-05-16 LAB — CBC
HCT: 41.2 % (ref 36.0–46.0)
Hemoglobin: 14 g/dL (ref 12.0–15.0)
MCH: 30.5 pg (ref 26.0–34.0)
MCHC: 34 g/dL (ref 30.0–36.0)
MCV: 89.8 fL (ref 80.0–100.0)
Platelets: 150 10*3/uL (ref 150–400)
RBC: 4.59 MIL/uL (ref 3.87–5.11)
RDW: 11.9 % (ref 11.5–15.5)
WBC: 6 10*3/uL (ref 4.0–10.5)
nRBC: 0 % (ref 0.0–0.2)

## 2021-05-16 LAB — RESP PANEL BY RT-PCR (FLU A&B, COVID) ARPGX2
Influenza A by PCR: NEGATIVE
Influenza B by PCR: NEGATIVE
SARS Coronavirus 2 by RT PCR: NEGATIVE

## 2021-05-16 LAB — TROPONIN I (HIGH SENSITIVITY): Troponin I (High Sensitivity): <2 ng/L (ref <18)

## 2021-05-16 MED ORDER — METFORMIN HCL 500 MG PO TABS: 500.0000 mg | ORAL_TABLET | Freq: Two times a day (BID) | ORAL | 5 refills | Status: DC

## 2021-05-16 MED ORDER — BLOOD GLUCOSE MONITOR KIT: PACK | 0 refills | Status: DC

## 2021-05-16 MED ORDER — INSULIN ASPART 100 UNIT/ML IJ SOLN
10.0000 [IU] | Freq: Once | INTRAMUSCULAR | Status: AC
  Administered 2021-05-16: 10 [IU] via INTRAVENOUS
  Filled 2021-05-16: qty 1

## 2021-05-16 MED ORDER — SODIUM CHLORIDE 0.9 % IV BOLUS
1000.0000 mL | Freq: Once | INTRAVENOUS | Status: AC
  Administered 2021-05-16: 1000 mL via INTRAVENOUS

## 2021-05-16 MED ORDER — BLOOD GLUCOSE MONITOR KIT: PACK | 0 refills | Status: AC

## 2021-05-16 MED ORDER — ONDANSETRON HCL 4 MG/2ML IJ SOLN
4.0000 mg | Freq: Once | INTRAMUSCULAR | Status: AC
  Administered 2021-05-16: 4 mg via INTRAVENOUS
  Filled 2021-05-16: qty 2

## 2021-05-16 NOTE — ED Notes (Signed)
Cbg 398

## 2021-05-16 NOTE — ED Provider Notes (Signed)
° °[facility] °Provider Note ° ° ° Event Date/Time  ° First MD Initiated Contact with Patient 05/16/21 0921   °  (approximate) ° °History  ° °Chief Complaint: Dizziness, weakness ° °HPI ° °Marie Curtis is a 66 y.o. female with a past medical history of hypertension presents to the emergency department for dizziness weakness frequent urination.  According to the patient over the past 2 weeks or so she has been experiencing vague symptoms of nausea dizziness, fatigue.  Patient states she has been urinating frequently and is often very thirsty.  No history of diabetes previously.  Patient has not eaten or drinking today.  Patient does state a slight cough and has been experiencing body aches as well but denies any fever or congestion. ° °Physical Exam  ° °Triage Vital Signs: °ED Triage Vitals  °Enc Vitals Group  °   BP 05/16/21 0857 (!) 172/81  °   Pulse Rate 05/16/21 0857 68  °   Resp 05/16/21 0857 17  °   Temp 05/16/21 0857 97.9 °F (36.6 °C)  °   Temp Source 05/16/21 0857 Oral  °   SpO2 05/16/21 0857 95 %  °   Weight 05/16/21 0908 189 lb (85.7 kg)  °   Height 05/16/21 0908 5' (1.524 m)  °   Head Circumference --   °   Peak Flow --   °   Pain Score 05/16/21 0908 0  °   Pain Loc --   °   Pain Edu? --   °   Excl. in GC? --   ° ° °Most recent vital signs: °Vitals:  ° 05/16/21 0857  °BP: (!) 172/81  °Pulse: 68  °Resp: 17  °Temp: 97.9 °F (36.6 °C)  °SpO2: 95%  ° ° °General: Awake, no distress.  °CV:  Good peripheral perfusion.  Regular rate and rhythm  °Resp:  Normal effort.  Equal breath sounds bilaterally.  °Abd:  No distention.  Soft, nontender.  No rebound or guarding. °Other:  No lower extremity edema. ° ° °ED Results / Procedures / Treatments  ° °EKG ° °EKG viewed and interpreted by myself shows a normal sinus rhythm at 72 bpm with a narrow QRS, normal axis, normal intervals including normal QTC.  No concerning ST changes noted. ° °MEDICATIONS ORDERED IN ED: °Medications  °sodium  chloride 0.9 % bolus 1,000 mL (has no administration in time range)  °ondansetron (ZOFRAN) injection 4 mg (has no administration in time range)  ° ° ° °IMPRESSION / MDM / ASSESSMENT AND PLAN / ED COURSE  °I reviewed the triage vital signs and the nursing notes. ° °Patient presents emergency department for 2 weeks of nausea dizziness fatigue body aches frequent urination.  Patient denies any chest pain or abdominal pain.  No vomiting or diarrhea.  We will check labs including cardiac enzymes, urinalysis.  We will IV hydrate and treat nausea.  Differential is quite broad but would include new onset diabetes/hyperglycemia, electrolyte or metabolic abnormality, dehydration, ACS, COVID, UTI. ° °Patient's labs have resulted showing elevated blood glucose consistent with new onset diabetes.  Patient has slight amount of ketones of 15 in the urine.  Patient receiving IV fluids.  Work-up otherwise is reassuring.  COVID and flu is negative.  Negative troponin.  Her renal function is normal and normal CBC.  I have had a long discussion with the patient regarding dietary changes as well as metformin twice daily and using her fingerstick twice daily before meals   at home keep in a diary of her fingerstick levels and following up with her primary care doctor.  Patient agreeable to this plan of care.  Medical Spanish interpreter used throughout this evaluation. ° °I reviewed the patient's last discharge summary from 11/22/2018 when she was seen for COVID-pneumonia at that time.  No signs of diabetes during her admission at that time. ° °FINAL CLINICAL IMPRESSION(S) / ED DIAGNOSES  ° °New onset diabetes ° °Rx / DC Orders  ° °Fingerstick blood glucose monitoring kit °Metformin twice daily °PCP follow-up ° °Note:  This document was prepared using Dragon voice recognition software and may include unintentional dictation errors. °  °Paduchowski, Kevin, MD °05/16/21 1357 ° °

## 2021-05-16 NOTE — Discharge Instructions (Signed)
As we discussed please begin taking your medication starting tonight and then twice daily.  Please record your blood sugars before breakfast and before dinner.  As we discussed please limit sugar/glucose intake.  Drink plenty of nonsugar fluids and follow-up with your doctor in 1 week for recheck/reevaluation.

## 2021-05-16 NOTE — ED Notes (Signed)
Demonstrated glucose check with glucometer

## 2021-05-16 NOTE — ED Notes (Signed)
Cbg 273 °

## 2021-05-16 NOTE — ED Notes (Signed)
Interpreter requested 

## 2021-05-16 NOTE — ED Triage Notes (Signed)
With interpreter present, pt c/o "bitter taste in my mouth" with generalized weakness, dizziness and body aches for the past 15 days. Pt is a/ox4 on arrival, steady gait on arrival

## 2021-05-21 DIAGNOSIS — Z23 Encounter for immunization: Secondary | ICD-10-CM | POA: Diagnosis not present

## 2021-05-21 DIAGNOSIS — E119 Type 2 diabetes mellitus without complications: Secondary | ICD-10-CM | POA: Diagnosis not present

## 2021-05-21 DIAGNOSIS — Z1329 Encounter for screening for other suspected endocrine disorder: Secondary | ICD-10-CM | POA: Diagnosis not present

## 2021-05-21 DIAGNOSIS — Z1389 Encounter for screening for other disorder: Secondary | ICD-10-CM | POA: Diagnosis not present

## 2021-05-21 DIAGNOSIS — I10 Essential (primary) hypertension: Secondary | ICD-10-CM | POA: Diagnosis not present

## 2021-05-21 DIAGNOSIS — Z0000 Encounter for general adult medical examination without abnormal findings: Secondary | ICD-10-CM | POA: Diagnosis not present

## 2021-05-21 DIAGNOSIS — Z0130 Encounter for examination of blood pressure without abnormal findings: Secondary | ICD-10-CM | POA: Diagnosis not present

## 2021-05-21 DIAGNOSIS — R413 Other amnesia: Secondary | ICD-10-CM | POA: Diagnosis not present

## 2021-05-21 DIAGNOSIS — Z113 Encounter for screening for infections with a predominantly sexual mode of transmission: Secondary | ICD-10-CM | POA: Diagnosis not present

## 2021-05-23 ENCOUNTER — Other Ambulatory Visit: Payer: Self-pay | Admitting: Physician Assistant

## 2021-05-23 DIAGNOSIS — Z13820 Encounter for screening for osteoporosis: Secondary | ICD-10-CM

## 2021-05-23 DIAGNOSIS — Z1231 Encounter for screening mammogram for malignant neoplasm of breast: Secondary | ICD-10-CM

## 2021-06-16 DIAGNOSIS — R413 Other amnesia: Secondary | ICD-10-CM | POA: Diagnosis not present

## 2021-06-19 ENCOUNTER — Other Ambulatory Visit: Payer: Self-pay | Admitting: Physician Assistant

## 2021-06-19 DIAGNOSIS — R413 Other amnesia: Secondary | ICD-10-CM

## 2021-07-02 ENCOUNTER — Ambulatory Visit
Admission: RE | Admit: 2021-07-02 | Discharge: 2021-07-02 | Disposition: A | Discharge status: Home / Self Care | Payer: Medicare HMO | Source: Ambulatory Visit | Attending: Physician Assistant | Admitting: Physician Assistant

## 2021-07-02 DIAGNOSIS — R413 Other amnesia: Secondary | ICD-10-CM | POA: Insufficient documentation

## 2021-09-29 DIAGNOSIS — R413 Other amnesia: Secondary | ICD-10-CM | POA: Diagnosis not present

## 2021-09-30 DIAGNOSIS — H2513 Age-related nuclear cataract, bilateral: Secondary | ICD-10-CM | POA: Diagnosis not present

## 2021-09-30 DIAGNOSIS — E119 Type 2 diabetes mellitus without complications: Secondary | ICD-10-CM | POA: Diagnosis not present

## 2021-10-02 DIAGNOSIS — H524 Presbyopia: Secondary | ICD-10-CM | POA: Diagnosis not present

## 2021-10-02 DIAGNOSIS — Z0100 Encounter for examination of eyes and vision without abnormal findings: Secondary | ICD-10-CM | POA: Diagnosis not present

## 2022-03-31 NOTE — Progress Notes (Unsigned)
There were no vitals taken for this visit.   Subjective:    Patient ID: Marie Curtis, female    DOB: Dec 16, 1955, 66 y.o.   MRN: [id number]  HPI: Marie Curtis is a 66 y.o. female  No chief complaint on file.  Patient presents to clinic to establish care with new PCP.  Introduced to Publishing rights manager role and practice setting.  All questions answered.  Discussed provider/patient relationship and expectations.  Patient reports a history of ***. Patient denies a history of: Hypertension, Elevated Cholesterol, Diabetes, Thyroid problems, Depression, Anxiety, Neurological problems, and Abdominal problems.   Active Ambulatory Problems    Diagnosis Date Noted   Pneumonia due to COVID-19 virus 11/19/2018   AKI (acute kidney injury) (HCC) 11/19/2018   Acute respiratory failure with hypoxia (HCC) 11/20/2018   Prolonged QT interval 11/20/2018   Essential hypertension 11/20/2018   Sepsis (HCC) 11/20/2018   Normal anion gap metabolic acidosis 11/21/2018   Resolved Ambulatory Problems    Diagnosis Date Noted   No Resolved Ambulatory Problems   Past Medical History:  Diagnosis Date   Headache(784.0)    Hypertension    Past Surgical History:  Procedure Laterality Date   CHOLECYSTECTOMY  05/06/2012   Procedure: LAPAROSCOPIC CHOLECYSTECTOMY;  Surgeon: Axel Filler, MD;  Location: [facility] OR;  Service: General;  Laterality: N/A;   NO PAST SURGERIES     No family history on file.   Review of Systems  Per HPI unless specifically indicated above     Objective:    There were no vitals taken for this visit.  Wt Readings from Last 3 Encounters:  05/16/21 189 lb (85.7 kg)  11/22/18 176 lb 2.4 oz (79.9 kg)  05/24/12 180 lb 8 oz (81.9 kg)    Physical Exam  Results for orders placed or performed during the hospital encounter of 05/16/21  Resp Panel by RT-PCR (Flu A&B, Covid) Nasopharyngeal Swab   Specimen: Nasopharyngeal Swab; Nasopharyngeal(NP) swabs in vial  transport medium  Result Value Ref Range   SARS Coronavirus 2 by RT PCR NEGATIVE NEGATIVE   Influenza A by PCR NEGATIVE NEGATIVE   Influenza B by PCR NEGATIVE NEGATIVE  CBC  Result Value Ref Range   WBC 6.0 4.0 - 10.5 K/uL   RBC 4.59 3.87 - 5.11 MIL/uL   Hemoglobin 14.0 12.0 - 15.0 g/dL   HCT 78.2 95.6 - 21.3 %   MCV 89.8 80.0 - 100.0 fL   MCH 30.5 26.0 - 34.0 pg   MCHC 34.0 30.0 - 36.0 g/dL   RDW 08.6 57.8 - 46.9 %   Platelets 150 150 - 400 K/uL   nRBC 0.0 0.0 - 0.2 %  Urinalysis, Routine w reflex microscopic  Result Value Ref Range   Color, Urine YELLOW YELLOW   APPearance CLEAR CLEAR   Specific Gravity, Urine 1.015 1.005 - 1.030   pH 5.5 5.0 - 8.0   Glucose, UA >1,000 (A) NEGATIVE mg/dL   Hgb urine dipstick NEGATIVE NEGATIVE   Bilirubin Urine NEGATIVE NEGATIVE   Ketones, ur 15 (A) NEGATIVE mg/dL   Protein, ur NEGATIVE NEGATIVE mg/dL   Nitrite NEGATIVE NEGATIVE   Leukocytes,Ua NEGATIVE NEGATIVE  Comprehensive metabolic panel  Result Value Ref Range   Sodium 136 135 - 145 mmol/L   Potassium 3.9 3.5 - 5.1 mmol/L   Chloride 102 98 - 111 mmol/L   CO2 24 22 - 32 mmol/L   Glucose, Bld 451 (H) 70 - 99 mg/dL   BUN 22 8 -  23 mg/dL   Creatinine, Ser 1.11 0.44 - 1.00 mg/dL   Calcium 9.0 8.9 - 55.2 mg/dL   Total Protein 7.9 6.5 - 8.1 g/dL   Albumin 4.3 3.5 - 5.0 g/dL   AST 26 15 - 41 U/L   ALT 53 (H) 0 - 44 U/L   Alkaline Phosphatase 126 38 - 126 U/L   Total Bilirubin 0.7 0.3 - 1.2 mg/dL   GFR, Estimated >08 >02 mL/min   Anion gap 10 5 - 15  CBG monitoring, ED  Result Value Ref Range   Glucose-Capillary 398 (H) 70 - 99 mg/dL  CBG monitoring, ED  Result Value Ref Range   Glucose-Capillary 273 (H) 70 - 99 mg/dL  CBG monitoring, ED  Result Value Ref Range   Glucose-Capillary 239 (H) 70 - 99 mg/dL  Troponin I (High Sensitivity)  Result Value Ref Range   Troponin I (High Sensitivity) <2 <18 ng/L      Assessment & Plan:   Problem List Items Addressed This Visit        Cardiovascular and Mediastinum   Essential hypertension - Primary     Follow up plan: No follow-ups on file.

## 2022-04-01 ENCOUNTER — Encounter: Payer: Self-pay | Admitting: Nurse Practitioner

## 2022-04-01 ENCOUNTER — Ambulatory Visit (INDEPENDENT_AMBULATORY_CARE_PROVIDER_SITE_OTHER): Payer: Medicare HMO | Admitting: Nurse Practitioner

## 2022-04-01 VITALS — BP 152/90 | HR 65 | Temp 98.6°F | Ht 60.3 in | Wt 179.0 lb

## 2022-04-01 DIAGNOSIS — I10 Essential (primary) hypertension: Secondary | ICD-10-CM

## 2022-04-01 DIAGNOSIS — Z1159 Encounter for screening for other viral diseases: Secondary | ICD-10-CM

## 2022-04-01 DIAGNOSIS — E118 Type 2 diabetes mellitus with unspecified complications: Secondary | ICD-10-CM | POA: Diagnosis not present

## 2022-04-01 DIAGNOSIS — E669 Obesity, unspecified: Secondary | ICD-10-CM

## 2022-04-01 DIAGNOSIS — Z136 Encounter for screening for cardiovascular disorders: Secondary | ICD-10-CM | POA: Diagnosis not present

## 2022-04-01 DIAGNOSIS — R413 Other amnesia: Secondary | ICD-10-CM | POA: Diagnosis not present

## 2022-04-01 DIAGNOSIS — E66811 Obesity, class 1: Secondary | ICD-10-CM | POA: Insufficient documentation

## 2022-04-01 DIAGNOSIS — Z23 Encounter for immunization: Secondary | ICD-10-CM | POA: Diagnosis not present

## 2022-04-01 LAB — MICROALBUMIN, URINE WAIVED
Creatinine, Urine Waived: 100 mg/dL (ref 10–300)
Microalb, Ur Waived: 30 mg/L — ABNORMAL HIGH (ref 0–19)
Microalb/Creat Ratio: <30 mg/g (ref <30)

## 2022-04-01 MED ORDER — ACCU-CHEK SOFTCLIX LANCETS MISC: 1.0000 | Freq: Every day | 1 refills | Status: DC

## 2022-04-01 MED ORDER — ACCU-CHEK GUIDE VI STRP: 1.0000 | ORAL_STRIP | Freq: Every day | 1 refills | Status: DC

## 2022-04-01 MED ORDER — ROSUVASTATIN CALCIUM 5 MG PO TABS: 5.0000 mg | ORAL_TABLET | Freq: Every day | ORAL | 1 refills | Status: DC

## 2022-04-01 MED ORDER — LISINOPRIL 20 MG PO TABS: 20.0000 mg | ORAL_TABLET | Freq: Every day | ORAL | 1 refills | Status: DC

## 2022-04-01 NOTE — Assessment & Plan Note (Signed)
Chronic. Not well controlled.  Will increase Lisinopril to 20mg  daily.  Labs ordered today. Follow up in 1 month.  Call sooner if concerns arise.

## 2022-04-01 NOTE — Addendum Note (Signed)
Addended by: Larae Grooms on: 04/01/2022 01:06 PM   Modules accepted: Orders

## 2022-04-01 NOTE — Assessment & Plan Note (Signed)
Recommended eating smaller high protein, low fat meals more frequently and exercising 30 mins a day 5 times a week with a goal of 10-15lb weight loss in the next 3 months. Patient voiced their understanding and motivation to adhere to these recommendations.  

## 2022-04-01 NOTE — Assessment & Plan Note (Signed)
Chronic. Not sure when last labs were done.  On Metformin 1000mg  BID. Will get eye exam results.  Patient started on Statin for cardio protection.  Labs ordered today. Will make recommendations based on lab results.

## 2022-04-01 NOTE — Assessment & Plan Note (Signed)
Chronic. Followed by Neurology.  Elected to trial Donzepil to prevent memory loss. Continue to collaborate with neurology.

## 2022-04-02 LAB — COMPREHENSIVE METABOLIC PANEL
ALT: 12 IU/L (ref 0–32)
AST: 14 IU/L (ref 0–40)
Albumin/Globulin Ratio: 1.6 (ref 1.2–2.2)
Albumin: 4.4 g/dL (ref 3.9–4.9)
Alkaline Phosphatase: 91 IU/L (ref 44–121)
BUN/Creatinine Ratio: 20 (ref 12–28)
BUN: 19 mg/dL (ref 8–27)
Bilirubin Total: 0.4 mg/dL (ref 0.0–1.2)
CO2: 22 mmol/L (ref 20–29)
Calcium: 9.2 mg/dL (ref 8.7–10.3)
Chloride: 105 mmol/L (ref 96–106)
Creatinine, Ser: 0.95 mg/dL (ref 0.57–1.00)
Globulin, Total: 2.8 g/dL (ref 1.5–4.5)
Glucose: 110 mg/dL — ABNORMAL HIGH (ref 70–99)
Potassium: 4.3 mmol/L (ref 3.5–5.2)
Sodium: 142 mmol/L (ref 134–144)
Total Protein: 7.2 g/dL (ref 6.0–8.5)
eGFR: 66 mL/min/{1.73_m2} (ref >59)

## 2022-04-02 LAB — HEMOGLOBIN A1C
Est. average glucose Bld gHb Est-mCnc: 131 mg/dL
Hgb A1c MFr Bld: 6.2 % — ABNORMAL HIGH (ref 4.8–5.6)

## 2022-04-02 LAB — LIPID PANEL
Chol/HDL Ratio: 3.8 ratio (ref 0.0–4.4)
Cholesterol, Total: 201 mg/dL — ABNORMAL HIGH (ref 100–199)
HDL: 53 mg/dL (ref >39)
LDL Chol Calc (NIH): 114 mg/dL — ABNORMAL HIGH (ref 0–99)
Triglycerides: 195 mg/dL — ABNORMAL HIGH (ref 0–149)
VLDL Cholesterol Cal: 34 mg/dL (ref 5–40)

## 2022-04-02 LAB — HEPATITIS C ANTIBODY: Hep C Virus Ab: NONREACTIVE

## 2022-04-02 NOTE — Progress Notes (Signed)
Please let patient know that her lab work shows that her A1c is well controlled at 6.2.  Her cholesterol is elevated but the new cholesterol medication should improve this.  No other concerns at this time.  Continue with current medication regimen.  Follow up as discussed.

## 2022-05-05 ENCOUNTER — Encounter: Payer: Self-pay | Admitting: Nurse Practitioner

## 2022-05-05 ENCOUNTER — Ambulatory Visit (INDEPENDENT_AMBULATORY_CARE_PROVIDER_SITE_OTHER): Payer: Medicare HMO | Admitting: Nurse Practitioner

## 2022-05-05 VITALS — BP 144/70 | HR 50 | Temp 97.9°F | Wt 178.1 lb

## 2022-05-05 DIAGNOSIS — I10 Essential (primary) hypertension: Secondary | ICD-10-CM | POA: Diagnosis not present

## 2022-05-05 MED ORDER — LISINOPRIL 40 MG PO TABS: 40.0000 mg | ORAL_TABLET | Freq: Every day | ORAL | 1 refills | Status: DC

## 2022-05-05 NOTE — Progress Notes (Signed)
BP (!) 144/70 (BP Location: Left Arm, Cuff Size: Normal)   Pulse (!) 50   Temp 97.9 F (36.6 C) (Oral)   Wt 178 lb 1.6 oz (80.8 kg)   SpO2 97%   BMI 34.44 kg/m    Subjective:    Patient ID: Marie Curtis, female    DOB: 06/29/1955, 67 y.o.   MRN: [id number]  HPI: Marie Curtis is a 67 y.o. female  Chief Complaint  Patient presents with   Hypertension   HYPERTENSION without Chronic Kidney Disease Hypertension status: uncontrolled  Satisfied with current treatment? yes Duration of hypertension: years BP monitoring frequency:  not checking BP range:  BP medication side effects:  no Medication compliance: excellent compliance Previous BP meds:lisinopril Aspirin: no Recurrent headaches: no Visual changes: no Palpitations: no Dyspnea: no Chest pain: no Lower extremity edema: no Dizzy/lightheaded: no  Relevant past medical, surgical, family and social history reviewed and updated as indicated. Interim medical history since our last visit reviewed. Allergies and medications reviewed and updated.  Review of Systems  Eyes:  Negative for visual disturbance.  Respiratory:  Negative for cough, chest tightness and shortness of breath.   Cardiovascular:  Negative for chest pain, palpitations and leg swelling.  Neurological:  Negative for dizziness and headaches.    Per HPI unless specifically indicated above     Objective:    BP (!) 144/70 (BP Location: Left Arm, Cuff Size: Normal)   Pulse (!) 50   Temp 97.9 F (36.6 C) (Oral)   Wt 178 lb 1.6 oz (80.8 kg)   SpO2 97%   BMI 34.44 kg/m   Wt Readings from Last 3 Encounters:  05/05/22 178 lb 1.6 oz (80.8 kg)  04/01/22 179 lb (81.2 kg)  05/16/21 189 lb (85.7 kg)    Physical Exam Vitals and nursing note reviewed.  Constitutional:      General: She is not in acute distress.    Appearance: Normal appearance. She is normal weight. She is not ill-appearing, toxic-appearing or diaphoretic.  HENT:      Head: Normocephalic.     Right Ear: External ear normal.     Left Ear: External ear normal.     Nose: Nose normal.     Mouth/Throat:     Mouth: Mucous membranes are moist.     Pharynx: Oropharynx is clear.  Eyes:     General:        Right eye: No discharge.        Left eye: No discharge.     Extraocular Movements: Extraocular movements intact.     Conjunctiva/sclera: Conjunctivae normal.     Pupils: Pupils are equal, round, and reactive to light.  Cardiovascular:     Rate and Rhythm: Normal rate and regular rhythm.     Heart sounds: No murmur heard. Pulmonary:     Effort: Pulmonary effort is normal. No respiratory distress.     Breath sounds: Normal breath sounds. No wheezing or rales.  Musculoskeletal:     Cervical back: Normal range of motion and neck supple.  Skin:    General: Skin is warm and dry.     Capillary Refill: Capillary refill takes less than 2 seconds.  Neurological:     General: No focal deficit present.     Mental Status: She is alert and oriented to person, place, and time. Mental status is at baseline.  Psychiatric:        Mood and Affect: Mood normal.  Behavior: Behavior normal.        Thought Content: Thought content normal.        Judgment: Judgment normal.     Results for orders placed or performed in visit on 04/01/22  HgB A1c  Result Value Ref Range   Hgb A1c MFr Bld 6.2 (H) 4.8 - 5.6 %   Est. average glucose Bld gHb Est-mCnc 131 mg/dL  Comprehensive metabolic panel  Result Value Ref Range   Glucose 110 (H) 70 - 99 mg/dL   BUN 19 8 - 27 mg/dL   Creatinine, Ser 0.95 0.57 - 1.00 mg/dL   eGFR 66 >59 mL/min/1.73   BUN/Creatinine Ratio 20 12 - 28   Sodium 142 134 - 144 mmol/L   Potassium 4.3 3.5 - 5.2 mmol/L   Chloride 105 96 - 106 mmol/L   CO2 22 20 - 29 mmol/L   Calcium 9.2 8.7 - 10.3 mg/dL   Total Protein 7.2 6.0 - 8.5 g/dL   Albumin 4.4 3.9 - 4.9 g/dL   Globulin, Total 2.8 1.5 - 4.5 g/dL   Albumin/Globulin Ratio 1.6 1.2 - 2.2    Bilirubin Total 0.4 0.0 - 1.2 mg/dL   Alkaline Phosphatase 91 44 - 121 IU/L   AST 14 0 - 40 IU/L   ALT 12 0 - 32 IU/L  Lipid Profile  Result Value Ref Range   Cholesterol, Total 201 (H) 100 - 199 mg/dL   Triglycerides 195 (H) 0 - 149 mg/dL   HDL 53 >39 mg/dL   VLDL Cholesterol Cal 34 5 - 40 mg/dL   LDL Chol Calc (NIH) 114 (H) 0 - 99 mg/dL   Chol/HDL Ratio 3.8 0.0 - 4.4 ratio  Microalbumin, Urine Waived  Result Value Ref Range   Microalb, Ur Waived 30 (H) 0 - 19 mg/L   Creatinine, Urine Waived 100 10 - 300 mg/dL   Microalb/Creat Ratio <30 <30 mg/g  Hepatitis C Antibody  Result Value Ref Range   Hep C Virus Ab Non Reactive Non Reactive      Assessment & Plan:   Problem List Items Addressed This Visit       Cardiovascular and Mediastinum   Essential hypertension - Primary    Chronic. Not well controlled.  Will increase Lisinopril to 40mg  daily.  Labs ordered today. Follow up in 2 months.  Call sooner if concerns arise.       Relevant Medications   lisinopril (ZESTRIL) 40 MG tablet     Follow up plan: Return in about 2 months (around 07/04/2022) for HTN, HLD, DM2 FU.

## 2022-05-05 NOTE — Assessment & Plan Note (Signed)
Chronic. Not well controlled.  Will increase Lisinopril to 40mg  daily.  Labs ordered today. Follow up in 2 months.  Call sooner if concerns arise.

## 2022-07-06 ENCOUNTER — Ambulatory Visit (INDEPENDENT_AMBULATORY_CARE_PROVIDER_SITE_OTHER): Payer: Medicare HMO | Admitting: Nurse Practitioner

## 2022-07-06 ENCOUNTER — Encounter: Payer: Self-pay | Admitting: Nurse Practitioner

## 2022-07-06 VITALS — BP 152/69 | HR 50 | Temp 97.7°F | Ht 60.4 in | Wt 177.0 lb

## 2022-07-06 DIAGNOSIS — Z780 Asymptomatic menopausal state: Secondary | ICD-10-CM

## 2022-07-06 DIAGNOSIS — Z0000 Encounter for general adult medical examination without abnormal findings: Secondary | ICD-10-CM | POA: Diagnosis not present

## 2022-07-06 DIAGNOSIS — E785 Hyperlipidemia, unspecified: Secondary | ICD-10-CM

## 2022-07-06 DIAGNOSIS — Z7189 Other specified counseling: Secondary | ICD-10-CM | POA: Diagnosis not present

## 2022-07-06 DIAGNOSIS — Z1231 Encounter for screening mammogram for malignant neoplasm of breast: Secondary | ICD-10-CM

## 2022-07-06 DIAGNOSIS — E1169 Type 2 diabetes mellitus with other specified complication: Secondary | ICD-10-CM | POA: Insufficient documentation

## 2022-07-06 DIAGNOSIS — Z6834 Body mass index (BMI) 34.0-34.9, adult: Secondary | ICD-10-CM | POA: Diagnosis not present

## 2022-07-06 DIAGNOSIS — I10 Essential (primary) hypertension: Secondary | ICD-10-CM

## 2022-07-06 DIAGNOSIS — Z7984 Long term (current) use of oral hypoglycemic drugs: Secondary | ICD-10-CM

## 2022-07-06 DIAGNOSIS — E669 Obesity, unspecified: Secondary | ICD-10-CM | POA: Diagnosis not present

## 2022-07-06 DIAGNOSIS — E118 Type 2 diabetes mellitus with unspecified complications: Secondary | ICD-10-CM

## 2022-07-06 LAB — MICROSCOPIC EXAMINATION: Bacteria, UA: NONE SEEN

## 2022-07-06 LAB — URINALYSIS, ROUTINE W REFLEX MICROSCOPIC
Bilirubin, UA: NEGATIVE
Glucose, UA: NEGATIVE
Ketones, UA: NEGATIVE
Nitrite, UA: NEGATIVE
Protein,UA: NEGATIVE
RBC, UA: NEGATIVE
Specific Gravity, UA: 1.01 (ref 1.005–1.030)
Urobilinogen, Ur: 0.2 mg/dL (ref 0.2–1.0)
pH, UA: 6 (ref 5.0–7.5)

## 2022-07-06 MED ORDER — AMLODIPINE BESYLATE 5 MG PO TABS: 5.0000 mg | ORAL_TABLET | Freq: Every day | ORAL | 1 refills | Status: DC

## 2022-07-06 NOTE — Progress Notes (Signed)
BP (!) 152/69 (BP Location: Right Arm, Cuff Size: Normal)   Pulse (!) 50   Temp 97.7 F (36.5 C) (Oral)   Ht 5' 0.4" (1.534 m)   Wt 177 lb (80.3 kg)   SpO2 98%   BMI 34.11 kg/m    Subjective:    Patient ID: Marie Curtis, female    DOB: Aug 31, 1955, 67 y.o.   MRN: [id number]  HPI: Marie Curtis is a 67 y.o. female presenting on 07/06/2022 for comprehensive medical examination. Current medical complaints include:none  She currently lives with: Menopausal Symptoms: no  HYPERTENSION / HYPERLIPIDEMIA Satisfied with current treatment? yes Duration of hypertension: years BP monitoring frequency: not checking BP range:  BP medication side effects: no Past BP meds: lisinopril Duration of hyperlipidemia: years Cholesterol medication side effects: no Cholesterol supplements: none Past cholesterol medications: rosuvastatin (crestor) Medication compliance: excellent compliance Aspirin: no Recent stressors: no Recurrent headaches: no Visual changes: no Palpitations: no Dyspnea: no Chest pain: no Lower extremity edema: no Dizzy/lightheaded: no  DIABETES Hypoglycemic episodes:no Polydipsia/polyuria: no Visual disturbance: no Chest pain: no Paresthesias: no Glucose Monitoring: yes  Accucheck frequency: Daily  Fasting glucose: 120  Post prandial:  Evening:  Before meals: Taking Insulin?: no  Long acting insulin:  Short acting insulin: Blood Pressure Monitoring: not checking Retinal Examination:  need to request records Foot Exam: Up to Date Diabetic Education: Not Completed Pneumovax: Up to Date Influenza: Up to Date Aspirin: no   Functional Status Survey: Is the patient deaf or have difficulty hearing?: No Does the patient have difficulty seeing, even when wearing glasses/contacts?: No Does the patient have difficulty concentrating, remembering, or making decisions?: No Does the patient have difficulty walking or climbing stairs?: No Does  the patient have difficulty dressing or bathing?: No Does the patient have difficulty doing errands alone such as visiting a doctor's office or shopping?: Yes     05/05/2022   10:19 AM  [place] in the past year? 0  Number falls in past yr: 0  Injury with Fall? 0  Risk for fall due to : No Fall Risks  Follow up Falls evaluation completed    Depression Screen    05/05/2022   10:19 AM  Depression screen PHQ 2/9  Decreased Interest 0  Down, Depressed, Hopeless 0  PHQ - 2 Score 0  Altered sleeping 0  Tired, decreased energy 0  Change in appetite 0  Feeling bad or failure about yourself  0  Trouble concentrating 0  Moving slowly or fidgety/restless 0  Suicidal thoughts 0  PHQ-9 Score 0  Difficult doing work/chores Not difficult at all     Advanced Directives Does patient have a HCPOA?    no If yes, name and contact information:  Does patient have a living will or MOST form?  no  Past Medical History:  Past Medical History:  Diagnosis Date   Diabetes mellitus without complication ([place])    [id number])    Hypertension     Surgical History:  Past Surgical History:  Procedure Laterality Date   CHOLECYSTECTOMY  05/06/2012   Procedure: LAPAROSCOPIC CHOLECYSTECTOMY;  Surgeon: Ralene Ok, MD;  Location: [facility] OR;  Service: General;  Laterality: N/A;   NO PAST SURGERIES      Medications:  Current Outpatient Medications on File Prior to Visit  Medication Sig   Accu-Chek Softclix Lancets lancets 1 each by Other route daily.   blood glucose meter kit and supplies KIT Dispense based on patient and  insurance preference. Use up to four times daily as directed.   glucose blood (ACCU-CHEK GUIDE) test strip 1 each by Other route daily.   lisinopril (ZESTRIL) 40 MG tablet Take 1 tablet (40 mg total) by mouth daily.   metFORMIN (GLUCOPHAGE) 500 MG tablet Take 500 mg by mouth 2 (two) times daily with a meal.   rosuvastatin (CRESTOR) 5 MG tablet Take 1 tablet (5 mg  total) by mouth daily.   donepezil (ARICEPT) 10 MG tablet Take 10 mg by mouth at bedtime. (Patient not taking: Reported on 07/06/2022)   No current facility-administered medications on file prior to visit.    Allergies:  No Known Allergies  Social History:  Social History   Socioeconomic History   Marital status: Married    Spouse name: Not on file   Number of children: Not on file   Years of education: Not on file   Highest education level: Not on file  Occupational History   Not on file  Tobacco Use   Smoking status: Never   Smokeless tobacco: Never  Vaping Use   Vaping Use: Never used  Substance and Sexual Activity   Alcohol use: No   Drug use: No   Sexual activity: Not on file  Other Topics Concern   Not on file  Social History Narrative   Married. Two children, 7 grandchildren. Lives with husband. Works in [employer].   Social Determinants of Health   Financial Resource Strain: Not on file  Food Insecurity: Not on file  Transportation Needs: Not on file  Physical Activity: Not on file  Stress: Not on file  Social Connections: Not on file  Intimate Partner Violence: Not on file   Social History   Tobacco Use  Smoking Status Never  Smokeless Tobacco Never   Social History   Substance and Sexual Activity  Alcohol Use No    Family History:  History reviewed. No pertinent family history.  Past medical history, surgical history, medications, allergies, family history and social history reviewed with patient today and changes made to appropriate areas of the chart.   Review of Systems  HENT:         Denies vision changes.  Eyes:  Negative for blurred vision and double vision.  Respiratory:  Negative for shortness of breath.   Cardiovascular:  Negative for chest pain, palpitations and leg swelling.  Neurological:  Negative for dizziness, tingling and headaches.  Endo/Heme/Allergies:  Negative for polydipsia.       Denies Polyuria    All  other ROS negative except what is listed above and in the HPI.      Objective:    BP (!) 152/69 (BP Location: Right Arm, Cuff Size: Normal)   Pulse (!) 50   Temp 97.7 F (36.5 C) (Oral)   Ht 5' 0.4" (1.534 m)   Wt 177 lb (80.3 kg)   SpO2 98%   BMI 34.11 kg/m   Wt Readings from Last 3 Encounters:  07/06/22 177 lb (80.3 kg)  05/05/22 178 lb 1.6 oz (80.8 kg)  04/01/22 179 lb (81.2 kg)    No results found.  Physical Exam Vitals and nursing note reviewed.  Constitutional:      General: She is awake. She is not in acute distress.    Appearance: Normal appearance. She is well-developed. She is obese. She is not ill-appearing.  HENT:     Head: Normocephalic and atraumatic.     Right Ear: Hearing, tympanic membrane, ear canal and  external ear normal. No drainage.     Left Ear: Hearing, tympanic membrane, ear canal and external ear normal. No drainage.     Nose: Nose normal.     Right Sinus: No maxillary sinus tenderness or frontal sinus tenderness.     Left Sinus: No maxillary sinus tenderness or frontal sinus tenderness.     Mouth/Throat:     Mouth: Mucous membranes are moist.     Pharynx: Oropharynx is clear. Uvula midline. No pharyngeal swelling, oropharyngeal exudate or posterior oropharyngeal erythema.  Eyes:     General: Lids are normal.        Right eye: No discharge.        Left eye: No discharge.     Extraocular Movements: Extraocular movements intact.     Conjunctiva/sclera: Conjunctivae normal.     Pupils: Pupils are equal, round, and reactive to light.     Visual Fields: Right eye visual fields normal and left eye visual fields normal.  Neck:     Thyroid: No thyromegaly.     Vascular: No carotid bruit.     Trachea: Trachea normal.  Cardiovascular:     Rate and Rhythm: Normal rate and regular rhythm.     Heart sounds: Normal heart sounds. No murmur heard.    No gallop.  Pulmonary:     Effort: Pulmonary effort is normal. No accessory muscle usage or respiratory  distress.     Breath sounds: Normal breath sounds.  Chest:  Breasts:    Right: Normal.     Left: Normal.  Abdominal:     General: Bowel sounds are normal.     Palpations: Abdomen is soft. There is no hepatomegaly or splenomegaly.     Tenderness: There is no abdominal tenderness.  Musculoskeletal:        General: Normal range of motion.     Cervical back: Normal range of motion and neck supple.     Right lower leg: No edema.     Left lower leg: No edema.  Lymphadenopathy:     Head:     Right side of head: No submental, submandibular, tonsillar, preauricular or posterior auricular adenopathy.     Left side of head: No submental, submandibular, tonsillar, preauricular or posterior auricular adenopathy.     Cervical: No cervical adenopathy.     Upper Body:     Right upper body: No supraclavicular, axillary or pectoral adenopathy.     Left upper body: No supraclavicular, axillary or pectoral adenopathy.  Skin:    General: Skin is warm and dry.     Capillary Refill: Capillary refill takes less than 2 seconds.     Findings: No rash.  Neurological:     Mental Status: She is alert and oriented to person, place, and time.     Gait: Gait is intact.  Psychiatric:        Attention and Perception: Attention normal.        Mood and Affect: Mood normal.        Speech: Speech normal.        Behavior: Behavior normal. Behavior is cooperative.        Thought Content: Thought content normal.        Judgment: Judgment normal.         No data to display          Cognitive Testing - 6-CIT  Correct? Score   What year is it? yes 0 Yes = 0    No = 4  What month is it? yes 0 Yes = 0    No = 3  Remember:     [address], [place], [place]     What time is it? yes 0 Yes = 0    No = 3  Count backwards from 20 to 1 no 2 Correct = 0    1 error = 2   More than 1 error = 4  Say the months of the year in reverse. yes 2 Correct = 0    1 error = 2   More than 1 error = 4  What address did I  ask you to remember? yes 2 Correct = 0  1 error = 2    2 error = 4    3 error = 6    4 error = 8    All wrong = 10       TOTAL SCORE  6/28   Interpretation:  Normal  Normal (0-7) Abnormal (8-28)   Results for orders placed or performed in visit on 04/01/22  HgB A1c  Result Value Ref Range   Hgb A1c MFr Bld 6.2 (H) 4.8 - 5.6 %   Est. average glucose Bld gHb Est-mCnc 131 mg/dL  Comprehensive metabolic panel  Result Value Ref Range   Glucose 110 (H) 70 - 99 mg/dL   BUN 19 8 - 27 mg/dL   Creatinine, Ser 0.95 0.57 - 1.00 mg/dL   eGFR 66 >59 mL/min/1.73   BUN/Creatinine Ratio 20 12 - 28   Sodium 142 134 - 144 mmol/L   Potassium 4.3 3.5 - 5.2 mmol/L   Chloride 105 96 - 106 mmol/L   CO2 22 20 - 29 mmol/L   Calcium 9.2 8.7 - 10.3 mg/dL   Total Protein 7.2 6.0 - 8.5 g/dL   Albumin 4.4 3.9 - 4.9 g/dL   Globulin, Total 2.8 1.5 - 4.5 g/dL   Albumin/Globulin Ratio 1.6 1.2 - 2.2   Bilirubin Total 0.4 0.0 - 1.2 mg/dL   Alkaline Phosphatase 91 44 - 121 IU/L   AST 14 0 - 40 IU/L   ALT 12 0 - 32 IU/L  Lipid Profile  Result Value Ref Range   Cholesterol, Total 201 (H) 100 - 199 mg/dL   Triglycerides 195 (H) 0 - 149 mg/dL   HDL 53 >39 mg/dL   VLDL Cholesterol Cal 34 5 - 40 mg/dL   LDL Chol Calc (NIH) 114 (H) 0 - 99 mg/dL   Chol/HDL Ratio 3.8 0.0 - 4.4 ratio  Microalbumin, Urine Waived  Result Value Ref Range   Microalb, Ur Waived 30 (H) 0 - 19 mg/L   Creatinine, Urine Waived 100 10 - 300 mg/dL   Microalb/Creat Ratio <30 <30 mg/g  Hepatitis C Antibody  Result Value Ref Range   Hep C Virus Ab Non Reactive Non Reactive      Assessment & Plan:   Problem List Items Addressed This Visit       Cardiovascular and Mediastinum   Essential hypertension    Chronic. Not well controlled.  Will add Amlodipine 5mg  to regimen.  Side effects and benefits of medication discussed during visit.  Continue with Lisinopril 40mg  daily.  Labs ordered.  Follow up in 1 month.  Call sooner if concerns arise.         Relevant Medications   amLODipine (NORVASC) 5 MG tablet     Endocrine   Controlled type 2 diabetes mellitus with complication, without long-term current use of  insulin (HCC)    Chronic. Controlled.  On Metformin 1000mg  BID. Will get eye exam results.  Patient started on Statin for cardio protection.  Labs ordered today. Will make recommendations based on lab results.       Hyperlipidemia associated with type 2 diabetes mellitus (HCC)    Chronic.  Controlled.  Continue with current medication regimen of Crestor 5mg .  Labs ordered today.  Return to clinic in 6 months for reevaluation.  Call sooner if concerns arise.        Relevant Medications   amLODipine (NORVASC) 5 MG tablet   Other Relevant Orders   Lipid panel     Other   Obesity (BMI 30.0-34.9)    Recommended eating smaller high protein, low fat meals more frequently and exercising 30 mins a day 5 times a week with a goal of 10-15lb weight loss in the next 3 months.       Advanced care planning/counseling discussion    A voluntary discussion about advance care planning including the explanation and discussion of advance directives was extensively discussed  with the patient for 5 minutes with patient.  Explanation about the health care proxy and Living will was reviewed and packet with forms with explanation of how to fill them out was given.  During this discussion, she states she plans to fill out the paperwork required.  Patient was offered a separate [place] visit for further assistance with forms.         Other Visit Diagnoses     Encounter for annual wellness exam in Medicare patient    -  Primary   Annual physical exam       Health maintenance reviewed during visit today.  Labs ordered.  Mammogram and Dexa orderd. Refused colon cancer screening   Relevant Orders   CBC with Differential/Platelet   Comprehensive metabolic panel   Lipid panel   TSH   Urinalysis, Routine w reflex microscopic   HgB  A1c   Encounter for screening mammogram for malignant neoplasm of breast       Relevant Orders   MM 3D SCREENING MAMMOGRAM BILATERAL BREAST   Post-menopausal       Relevant Orders   DG Bone Density        Preventative Services:  AAA screening: NA Health Risk Assessment and Personalized Prevention Plan: Up to date  Bone Mass Measurements: Scheduled May 1 Breast Cancer Screening: Scheduled May 1 CVD Screening: Up to date Cervical Cancer Screening: NA Colon Cancer Screening: Refused Depression Screening: Up to date Diabetes Screening: Up to date Glaucoma Screening: Up to date Hepatitis B vaccine: NA Hepatitis C screening: Up to date HIV Screening: Up to date Flu Vaccine: Up to date Lung cancer Screening: NA Obesity Screening: Up to date Pneumonia Vaccines (2): Up to date STI Screening: NA  Follow up plan: Return in about 1 month (around 08/06/2022) for BP Check.   LABORATORY TESTING:  - Pap smear: not applicable  IMMUNIZATIONS:   - Tdap: Tetanus vaccination status reviewed: Discussed at visit today. - Influenza: Up to date - Pneumovax: Up to date - Prevnar: Up to date - Zostavax vaccine:  Discussed at visit today  SCREENING: -Mammogram: Ordered today  - Colonoscopy: Refused  - Bone Density: Ordered today  -Hearing Test: Not applicable  -Spirometry: Not applicable   PATIENT COUNSELING:   Advised to take 1 mg of folate supplement per day if capable of pregnancy.   Sexuality: Discussed sexually transmitted diseases, partner selection, use  of condoms, avoidance of unintended pregnancy  and contraceptive alternatives.   Advised to avoid cigarette smoking.  I discussed with the patient that most people either abstain from alcohol or drink within safe limits (<=14/week and <=4 drinks/occasion for males, <=7/weeks and <= 3 drinks/occasion for females) and that the risk for alcohol disorders and other health effects rises proportionally with the number of drinks per week  and how often a drinker exceeds daily limits.  Discussed cessation/primary prevention of drug use and availability of treatment for abuse.   Diet: Encouraged to adjust caloric intake to maintain  or achieve ideal body weight, to reduce intake of dietary saturated fat and total fat, to limit sodium intake by avoiding high sodium foods and not adding table salt, and to maintain adequate dietary potassium and calcium preferably from fresh fruits, vegetables, and low-fat dairy products.    stressed the importance of regular exercise  Injury prevention: Discussed safety belts, safety helmets, smoke detector, smoking near bedding or upholstery.   Dental health: Discussed importance of regular tooth brushing, flossing, and dental visits.    NEXT PREVENTATIVE PHYSICAL DUE IN 1 YEAR. Return in about 1 month (around 08/06/2022) for BP Check.

## 2022-07-06 NOTE — Assessment & Plan Note (Signed)
Chronic. Not well controlled.  Will add Amlodipine 5mg  to regimen.  Side effects and benefits of medication discussed during visit.  Continue with Lisinopril 40mg  daily.  Labs ordered.  Follow up in 1 month.  Call sooner if concerns arise.

## 2022-07-06 NOTE — Assessment & Plan Note (Signed)
Chronic. Controlled.  On Metformin 1000mg  BID. Will get eye exam results.  Patient started on Statin for cardio protection.  Labs ordered today. Will make recommendations based on lab results.

## 2022-07-06 NOTE — Patient Instructions (Addendum)
Your mammogram and bone density are scheduled for 08/19/22 at 10:40 AM at [facility] [place]: [address], [place], [place] [address] Phone: [phone]

## 2022-07-06 NOTE — Assessment & Plan Note (Signed)
Chronic.  Controlled.  Continue with current medication regimen of Crestor 5mg.  Labs ordered today.  Return to clinic in 6 months for reevaluation.  Call sooner if concerns arise.   

## 2022-07-06 NOTE — Assessment & Plan Note (Signed)
A voluntary discussion about advance care planning including the explanation and discussion of advance directives was extensively discussed  with the patient for 5 minutes with patient.  Explanation about the health care proxy and Living will was reviewed and packet with forms with explanation of how to fill them out was given.  During this discussion, she states she plans to fill out the paperwork required.  Patient was offered a separate [place] visit for further assistance with forms.

## 2022-07-06 NOTE — Assessment & Plan Note (Signed)
Recommended eating smaller high protein, low fat meals more frequently and exercising 30 mins a day 5 times a week with a goal of 10-15lb weight loss in the next 3 months.  

## 2022-07-07 LAB — LIPID PANEL
Chol/HDL Ratio: 2.5 ratio (ref 0.0–4.4)
Cholesterol, Total: 135 mg/dL (ref 100–199)
HDL: 55 mg/dL (ref >39)
LDL Chol Calc (NIH): 55 mg/dL (ref 0–99)
Triglycerides: 145 mg/dL (ref 0–149)
VLDL Cholesterol Cal: 25 mg/dL (ref 5–40)

## 2022-07-07 LAB — HEMOGLOBIN A1C
Est. average glucose Bld gHb Est-mCnc: 137 mg/dL
Hgb A1c MFr Bld: 6.4 % — ABNORMAL HIGH (ref 4.8–5.6)

## 2022-07-07 LAB — CBC WITH DIFFERENTIAL/PLATELET
Basophils Absolute: 0 10*3/uL (ref 0.0–0.2)
Basos: 1 %
EOS (ABSOLUTE): 0.1 10*3/uL (ref 0.0–0.4)
Eos: 2 %
Hematocrit: 40.3 % (ref 34.0–46.6)
Hemoglobin: 13.5 g/dL (ref 11.1–15.9)
Immature Grans (Abs): 0 10*3/uL (ref 0.0–0.1)
Immature Granulocytes: 0 %
Lymphocytes Absolute: 1.8 10*3/uL (ref 0.7–3.1)
Lymphs: 30 %
MCH: 30.5 pg (ref 26.6–33.0)
MCHC: 33.5 g/dL (ref 31.5–35.7)
MCV: 91 fL (ref 79–97)
Monocytes Absolute: 0.3 10*3/uL (ref 0.1–0.9)
Monocytes: 5 %
Neutrophils Absolute: 3.8 10*3/uL (ref 1.4–7.0)
Neutrophils: 62 %
Platelets: 167 10*3/uL (ref 150–450)
RBC: 4.43 x10E6/uL (ref 3.77–5.28)
RDW: 13.1 % (ref 11.7–15.4)
WBC: 6.2 10*3/uL (ref 3.4–10.8)

## 2022-07-07 LAB — COMPREHENSIVE METABOLIC PANEL
ALT: 9 IU/L (ref 0–32)
AST: 11 IU/L (ref 0–40)
Albumin/Globulin Ratio: 1.6 (ref 1.2–2.2)
Albumin: 4.6 g/dL (ref 3.9–4.9)
Alkaline Phosphatase: 88 IU/L (ref 44–121)
BUN/Creatinine Ratio: 18 (ref 12–28)
BUN: 17 mg/dL (ref 8–27)
Bilirubin Total: 0.5 mg/dL (ref 0.0–1.2)
CO2: 24 mmol/L (ref 20–29)
Calcium: 9.1 mg/dL (ref 8.7–10.3)
Chloride: 104 mmol/L (ref 96–106)
Creatinine, Ser: 0.92 mg/dL (ref 0.57–1.00)
Globulin, Total: 2.9 g/dL (ref 1.5–4.5)
Glucose: 109 mg/dL — ABNORMAL HIGH (ref 70–99)
Potassium: 4.3 mmol/L (ref 3.5–5.2)
Sodium: 142 mmol/L (ref 134–144)
Total Protein: 7.5 g/dL (ref 6.0–8.5)
eGFR: 68 mL/min/{1.73_m2} (ref >59)

## 2022-07-07 LAB — TSH: TSH: 1.64 u[IU]/mL (ref 0.450–4.500)

## 2022-07-07 NOTE — Progress Notes (Signed)
Please let patient know that her lab work looks good.  Her A1c is well controlled at 6.4%.  Otherwise, her blood work looks good.  Continue with current medication regimen.  Follow up as discussed.

## 2022-08-05 NOTE — Progress Notes (Unsigned)
There were no vitals taken for this visit.   Subjective:    Patient ID: Marie Curtis, female    DOB: 1956/01/17, 67 y.o.   MRN: [id number]  HPI: Marie Curtis is a 67 y.o. female  No chief complaint on file.  HYPERTENSION {Blank single:19197::"without","with"} Chronic Kidney Disease Hypertension status: {Blank single:19197::"controlled","uncontrolled","better","worse","exacerbated","stable"}  Satisfied with current treatment? {Blank single:19197::"yes","no"} Duration of hypertension: {Blank single:19197::"chronic","months","years"} BP monitoring frequency:  {Blank single:19197::"not checking","rarely","daily","weekly","monthly","a few times a day","a few times a week","a few times a month"} BP range:  BP medication side effects:  {Blank single:19197::"yes","no"} Medication compliance: {Blank single:19197::"excellent compliance","good compliance","fair compliance","poor compliance"} Previous BP meds:{Blank multiple:19196::"none","amlodipine","amlodipine/benazepril","atenolol","benazepril","benazepril/HCTZ","bisoprolol (bystolic)","carvedilol","chlorthalidone","clonidine","diltiazem","exforge HCT","HCTZ","irbesartan (avapro)","labetalol","lisinopril","lisinopril-HCTZ","losartan (cozaar)","methyldopa","nifedipine","olmesartan (benicar)","olmesartan-HCTZ","quinapril","ramipril","spironalactone","tekturna","valsartan","valsartan-HCTZ","verapamil"} Aspirin: {Blank single:19197::"yes","no"} Recurrent headaches: {Blank single:19197::"yes","no"} Visual changes: {Blank single:19197::"yes","no"} Palpitations: {Blank single:19197::"yes","no"} Dyspnea: {Blank single:19197::"yes","no"} Chest pain: {Blank single:19197::"yes","no"} Lower extremity edema: {Blank single:19197::"yes","no"} Dizzy/lightheaded: {Blank single:19197::"yes","no"}  Relevant past medical, surgical, family and social history reviewed and updated as indicated. Interim medical history since our last visit  reviewed. Allergies and medications reviewed and updated.  Review of Systems  Per HPI unless specifically indicated above     Objective:    There were no vitals taken for this visit.  Wt Readings from Last 3 Encounters:  07/06/22 177 lb (80.3 kg)  05/05/22 178 lb 1.6 oz (80.8 kg)  04/01/22 179 lb (81.2 kg)    Physical Exam  Results for orders placed or performed in visit on 07/06/22  Microscopic Examination   Urine  Result Value Ref Range   WBC, UA 0-5 0 - 5 /hpf   RBC, Urine 0-2 0 - 2 /hpf   Epithelial Cells (non renal) 0-10 0 - 10 /hpf   Bacteria, UA None seen None seen/Few  CBC with Differential/Platelet  Result Value Ref Range   WBC 6.2 3.4 - 10.8 x10E3/uL   RBC 4.43 3.77 - 5.28 x10E6/uL   Hemoglobin 13.5 11.1 - 15.9 g/dL   Hematocrit 11.9 14.7 - 46.6 %   MCV 91 79 - 97 fL   MCH 30.5 26.6 - 33.0 pg   MCHC 33.5 31.5 - 35.7 g/dL   RDW 82.9 56.2 - 13.0 %   Platelets 167 150 - 450 x10E3/uL   Neutrophils 62 Not Estab. %   Lymphs 30 Not Estab. %   Monocytes 5 Not Estab. %   Eos 2 Not Estab. %   Basos 1 Not Estab. %   Neutrophils Absolute 3.8 1.4 - 7.0 x10E3/uL   Lymphocytes Absolute 1.8 0.7 - 3.1 x10E3/uL   Monocytes Absolute 0.3 0.1 - 0.9 x10E3/uL   EOS (ABSOLUTE) 0.1 0.0 - 0.4 x10E3/uL   Basophils Absolute 0.0 0.0 - 0.2 x10E3/uL   Immature Granulocytes 0 Not Estab. %   Immature Grans (Abs) 0.0 0.0 - 0.1 x10E3/uL  Comprehensive metabolic panel  Result Value Ref Range   Glucose 109 (H) 70 - 99 mg/dL   BUN 17 8 - 27 mg/dL   Creatinine, Ser 8.65 0.57 - 1.00 mg/dL   eGFR 68 >78 IO/NGE/9.52   BUN/Creatinine Ratio 18 12 - 28   Sodium 142 134 - 144 mmol/L   Potassium 4.3 3.5 - 5.2 mmol/L   Chloride 104 96 - 106 mmol/L   CO2 24 20 - 29 mmol/L   Calcium 9.1 8.7 - 10.3 mg/dL   Total Protein 7.5 6.0 - 8.5 g/dL   Albumin 4.6 3.9 - 4.9 g/dL   Globulin, Total 2.9 1.5 - 4.5 g/dL   Albumin/Globulin Ratio 1.6 1.2 - 2.2   Bilirubin Total 0.5  0.0 - 1.2 mg/dL   Alkaline  Phosphatase 88 44 - 121 IU/L   AST 11 0 - 40 IU/L   ALT 9 0 - 32 IU/L  Lipid panel  Result Value Ref Range   Cholesterol, Total 135 100 - 199 mg/dL   Triglycerides 161 0 - 149 mg/dL   HDL 55 >09 mg/dL   VLDL Cholesterol Cal 25 5 - 40 mg/dL   LDL Chol Calc (NIH) 55 0 - 99 mg/dL   Chol/HDL Ratio 2.5 0.0 - 4.4 ratio  TSH  Result Value Ref Range   TSH 1.640 0.450 - 4.500 uIU/mL  Urinalysis, Routine w reflex microscopic  Result Value Ref Range   Specific Gravity, UA 1.010 1.005 - 1.030   pH, UA 6.0 5.0 - 7.5   Color, UA Yellow Yellow   Appearance Ur Cloudy (A) Clear   Leukocytes,UA Trace (A) Negative   Protein,UA Negative Negative/Trace   Glucose, UA Negative Negative   Ketones, UA Negative Negative   RBC, UA Negative Negative   Bilirubin, UA Negative Negative   Urobilinogen, Ur 0.2 0.2 - 1.0 mg/dL   Nitrite, UA Negative Negative   Microscopic Examination See below:   HgB A1c  Result Value Ref Range   Hgb A1c MFr Bld 6.4 (H) 4.8 - 5.6 %   Est. average glucose Bld gHb Est-mCnc 137 mg/dL      Assessment & Plan:   Problem List Items Addressed This Visit       Cardiovascular and Mediastinum   Essential hypertension - Primary     Follow up plan: No follow-ups on file.

## 2022-08-06 ENCOUNTER — Encounter: Payer: Self-pay | Admitting: Nurse Practitioner

## 2022-08-06 ENCOUNTER — Ambulatory Visit: Payer: Medicare HMO | Admitting: Nurse Practitioner

## 2022-08-06 ENCOUNTER — Ambulatory Visit (INDEPENDENT_AMBULATORY_CARE_PROVIDER_SITE_OTHER): Payer: Medicare HMO | Admitting: Nurse Practitioner

## 2022-08-06 ENCOUNTER — Other Ambulatory Visit: Payer: Self-pay | Admitting: Nurse Practitioner

## 2022-08-06 VITALS — BP 123/62 | HR 48 | Temp 97.6°F | Wt 176.4 lb

## 2022-08-06 DIAGNOSIS — I10 Essential (primary) hypertension: Secondary | ICD-10-CM

## 2022-08-06 MED ORDER — METFORMIN HCL 500 MG PO TABS: 500.0000 mg | ORAL_TABLET | Freq: Two times a day (BID) | ORAL | 1 refills | Status: DC

## 2022-08-06 MED ORDER — ROSUVASTATIN CALCIUM 5 MG PO TABS: 5.0000 mg | ORAL_TABLET | Freq: Every day | ORAL | 1 refills | Status: DC

## 2022-08-06 MED ORDER — LISINOPRIL 40 MG PO TABS: 40.0000 mg | ORAL_TABLET | Freq: Every day | ORAL | 1 refills | Status: DC

## 2022-08-06 MED ORDER — AMLODIPINE BESYLATE 5 MG PO TABS: 5.0000 mg | ORAL_TABLET | Freq: Every day | ORAL | 1 refills | Status: DC

## 2022-08-06 NOTE — Assessment & Plan Note (Signed)
Chronic.  Controlled.  Continue with current medication regimen of Lisinopril 40mg and Amlodipine 5mg.  Refills sent today.  Return to clinic in3 months for reevaluation.  Call sooner if concerns arise.   

## 2022-08-06 NOTE — Telephone Encounter (Signed)
Duplicate request, already ordered today in another encounter.  Requested Prescriptions  Refused Prescriptions Disp Refills   rosuvastatin (CRESTOR) 5 MG tablet [Pharmacy Med Name: ROSUVASTATIN CALCIUM 5 MG TAB] 90 tablet 0    Sig: Take 1 tablet (5 mg total) by mouth daily.     Cardiovascular:  Antilipid - Statins 2 Failed - 08/06/2022 10:32 AM      Failed - Lipid Panel in normal range within the last 12 months    Cholesterol, Total  Date Value Ref Range Status  07/06/2022 135 100 - 199 mg/dL Final   LDL Chol Calc (NIH)  Date Value Ref Range Status  07/06/2022 55 0 - 99 mg/dL Final   HDL  Date Value Ref Range Status  07/06/2022 55 >39 mg/dL Final   Triglycerides  Date Value Ref Range Status  07/06/2022 145 0 - 149 mg/dL Final         Passed - Cr in normal range and within 360 days    Creatinine  Date Value Ref Range Status  10/29/2011 0.66 0.60 - 1.30 mg/dL Final   Creatinine, Ser  Date Value Ref Range Status  07/06/2022 0.92 0.57 - 1.00 mg/dL Final         Passed - Patient is not pregnant      Passed - Valid encounter within last 12 months    Recent Outpatient Visits           Today Essential hypertension   [place] [facility] Larae Grooms, NP   1 month ago Encounter for annual wellness exam in Medicare patient   [place] [facility] Larae Grooms, NP   3 months ago Essential hypertension   [place] [facility] Larae Grooms, NP   4 months ago Controlled type 2 diabetes mellitus with complication, without long-term current use of insulin (HCC)   [place] [facility] Larae Grooms, NP       Future Appointments             In 3 months Mecum, Oswaldo Conroy, PA-C [facility]

## 2022-08-19 ENCOUNTER — Ambulatory Visit
Admission: RE | Admit: 2022-08-19 | Discharge: 2022-08-19 | Disposition: A | Discharge status: Home / Self Care | Payer: Medicare HMO | Source: Ambulatory Visit | Attending: Nurse Practitioner | Admitting: Nurse Practitioner

## 2022-08-19 DIAGNOSIS — Z1231 Encounter for screening mammogram for malignant neoplasm of breast: Secondary | ICD-10-CM

## 2022-08-19 DIAGNOSIS — M85832 Other specified disorders of bone density and structure, left forearm: Secondary | ICD-10-CM | POA: Diagnosis not present

## 2022-08-19 DIAGNOSIS — Z780 Asymptomatic menopausal state: Secondary | ICD-10-CM | POA: Diagnosis not present

## 2022-08-19 NOTE — Progress Notes (Signed)
Please let patient know that her bone density looks good.  She does have some osteopenia in her left forearm.  We will monitor this in the future.  I recommend taking Calcium 1,200mg  daily to help with this.

## 2022-08-20 NOTE — Progress Notes (Signed)
Please let patient know her Mammogram did not show any evidence of a malignancy.  The recommendation is to repeat the Mammogram in 1 year.  

## 2022-09-21 ENCOUNTER — Encounter: Payer: Self-pay | Admitting: Nurse Practitioner

## 2022-10-02 DIAGNOSIS — E119 Type 2 diabetes mellitus without complications: Secondary | ICD-10-CM | POA: Diagnosis not present

## 2022-10-02 DIAGNOSIS — Z0100 Encounter for examination of eyes and vision without abnormal findings: Secondary | ICD-10-CM | POA: Diagnosis not present

## 2022-10-02 DIAGNOSIS — H2513 Age-related nuclear cataract, bilateral: Secondary | ICD-10-CM | POA: Diagnosis not present

## 2022-10-02 LAB — HM DIABETES EYE EXAM

## 2022-10-05 ENCOUNTER — Encounter: Payer: Self-pay | Admitting: Nurse Practitioner

## 2022-11-05 ENCOUNTER — Ambulatory Visit: Payer: Medicare HMO | Admitting: Physician Assistant

## 2022-11-06 ENCOUNTER — Encounter: Payer: Self-pay | Admitting: Physician Assistant

## 2022-11-06 ENCOUNTER — Ambulatory Visit (INDEPENDENT_AMBULATORY_CARE_PROVIDER_SITE_OTHER): Payer: Medicare HMO | Admitting: Physician Assistant

## 2022-11-06 VITALS — BP 158/68 | HR 52 | Ht 60.4 in | Wt 177.4 lb

## 2022-11-06 DIAGNOSIS — E118 Type 2 diabetes mellitus with unspecified complications: Secondary | ICD-10-CM

## 2022-11-06 DIAGNOSIS — I10 Essential (primary) hypertension: Secondary | ICD-10-CM

## 2022-11-06 DIAGNOSIS — E785 Hyperlipidemia, unspecified: Secondary | ICD-10-CM | POA: Diagnosis not present

## 2022-11-06 DIAGNOSIS — E1169 Type 2 diabetes mellitus with other specified complication: Secondary | ICD-10-CM | POA: Diagnosis not present

## 2022-11-06 LAB — BAYER DCA HB A1C WAIVED: HB A1C (BAYER DCA - WAIVED): 6.6 % — ABNORMAL HIGH (ref 4.8–5.6)

## 2022-11-06 MED ORDER — ACCU-CHEK GUIDE VI STRP: 1.0000 | ORAL_STRIP | Freq: Every day | 1 refills | Status: DC

## 2022-11-06 MED ORDER — ACCU-CHEK SOFTCLIX LANCETS MISC: 1.0000 | Freq: Every day | 1 refills | Status: DC

## 2022-11-06 NOTE — Progress Notes (Signed)
Your A1c was 6.6 which is in goal range- please continue your current medications and let us know if you have further questions.

## 2022-11-06 NOTE — Progress Notes (Unsigned)
Established Patient Office Visit  Name: Marie Curtis   MRN: [id number]    DOB: 05/18/1955   Date:11/09/2022  Today's Provider: Jacquelin Hawking, MHS, PA-C Introduced myself to the patient as a PA-C and provided education on APPs in clinical practice.         Subjective  Chief Complaint  Chief Complaint  Patient presents with   Hyperlipidemia   Hypertension   Diabetes   Medication Refill    Patient is requesting refills on her medications and supplies.     HPI  She is here with her husband and has access to translator services- they declined saying her husband will translate for her   HYPERTENSION / HYPERLIPIDEMIA Satisfied with current treatment? no Duration of hypertension: years BP monitoring frequency: daily BP range: 130s-140s/60s-70s BP medication side effects: no Past BP meds: amlodipine and lisinopril Duration of hyperlipidemia: chronic Cholesterol medication side effects: no Cholesterol supplements: none Past cholesterol medications: rosuvastatin (crestor) Medication compliance: good compliance Aspirin: no Recent stressors: no Recurrent headaches: no Visual changes: no Palpitations: no Dyspnea: no Chest pain: no Lower extremity edema: no Dizzy/lightheaded: no  Diabetes, Type 2 - Last A1c 6.4 - Medications: Metformin 500 mg PO BID  - Compliance: Good compliance  - Checking BG at home: daily checks in afternoon, checks about an hour after eating   - getting 105-115, every once in awhile goes up to 125  - Eye exam: UTD - Foot exam: UTD - Microalbumin: UTD - Statin: on statin - PNA vaccine: Completed  - Denies symptoms of hypoglycemia, polyuria, polydipsia, numbness extremities, foot ulcers/trauma      Patient Active Problem List   Diagnosis Date Noted   Hyperlipidemia associated with type 2 diabetes mellitus (HCC) 07/06/2022   Advanced care planning/counseling discussion 07/06/2022   Memory loss 04/01/2022   Controlled type 2  diabetes mellitus with complication, without long-term current use of insulin (HCC) 04/01/2022   Obesity (BMI 30.0-34.9) 04/01/2022   Normal anion gap metabolic acidosis 11/21/2018   Prolonged QT interval 11/20/2018   Essential hypertension 11/20/2018    Past Surgical History:  Procedure Laterality Date   CHOLECYSTECTOMY  05/06/2012   Procedure: LAPAROSCOPIC CHOLECYSTECTOMY;  Surgeon: Axel Filler, MD;  Location: [facility] OR;  Service: General;  Laterality: N/A;   NO PAST SURGERIES      History reviewed. No pertinent family history.  Social History   Tobacco Use   Smoking status: Never   Smokeless tobacco: Never  Substance Use Topics   Alcohol use: No     Current Outpatient Medications:    amLODipine (NORVASC) 5 MG tablet, Take 1 tablet (5 mg total) by mouth daily., Disp: 90 tablet, Rfl: 1   blood glucose meter kit and supplies KIT, Dispense based on patient and insurance preference. Use up to four times daily as directed., Disp: 1 each, Rfl: 0   lisinopril (ZESTRIL) 40 MG tablet, Take 1 tablet (40 mg total) by mouth daily., Disp: 90 tablet, Rfl: 1   metFORMIN (GLUCOPHAGE) 500 MG tablet, Take 1 tablet (500 mg total) by mouth 2 (two) times daily with a meal., Disp: 180 tablet, Rfl: 1   rosuvastatin (CRESTOR) 5 MG tablet, Take 1 tablet (5 mg total) by mouth daily., Disp: 90 tablet, Rfl: 1   Accu-Chek Softclix Lancets lancets, 1 each by Other route daily., Disp: 100 each, Rfl: 1   glucose blood (ACCU-CHEK GUIDE) test strip, 1 each by Other route daily., Disp: 100 each, Rfl:  1  No Known Allergies  I personally reviewed active problem list, medication list, allergies, health maintenance, notes from last encounter, lab results with the patient/caregiver today.   Review of Systems  Eyes:  Negative for blurred vision and double vision.  Respiratory:  Negative for shortness of breath and wheezing.   Cardiovascular:  Negative for chest pain, palpitations and leg swelling.   Genitourinary:  Negative for frequency.  Neurological:  Negative for dizziness, tingling and headaches.  Endo/Heme/Allergies:  Negative for polydipsia.      Objective  Vitals:   11/06/22 1008 11/06/22 1013  BP: (!) 157/68 (!) 158/68  Pulse: (!) 49 (!) 52  SpO2: 96%   Weight: 177 lb 6.4 oz (80.5 kg)   Height: 5' 0.4" (1.534 m)     Body mass index is 34.19 kg/m.  Physical Exam Vitals reviewed.  Constitutional:      General: She is awake.     Appearance: Normal appearance. She is well-developed and well-groomed.  HENT:     Head: Normocephalic and atraumatic.  Cardiovascular:     Rate and Rhythm: Normal rate and regular rhythm.     Pulses: Normal pulses.     Heart sounds: Normal heart sounds.  Musculoskeletal:     Right lower leg: No edema.     Left lower leg: No edema.  Neurological:     Mental Status: She is alert.  Psychiatric:        Behavior: Behavior is cooperative.      Recent Results (from the past 2160 hour(s))  HM DIABETES EYE EXAM     Status: None   Collection Time: 10/02/22 12:00 AM  Result Value Ref Range   HM Diabetic Eye Exam No Retinopathy No Retinopathy  Bayer DCA Hb A1c Waived (STAT)     Status: Abnormal   Collection Time: 11/06/22 11:43 AM  Result Value Ref Range   HB A1C (BAYER DCA - WAIVED) 6.6 (H) 4.8 - 5.6 %    Comment:          Prediabetes: 5.7 - 6.4          Diabetes: >6.4          Glycemic control for adults with diabetes: <7.0      PHQ2/9:    11/06/2022   10:16 AM 05/05/2022   10:19 AM  Depression screen PHQ 2/9  Decreased Interest 0 0  Down, Depressed, Hopeless 0 0  PHQ - 2 Score 0 0  Altered sleeping 0 0  Tired, decreased energy 0 0  Change in appetite 0 0  Feeling bad or failure about yourself  0 0  Trouble concentrating 0 0  Moving slowly or fidgety/restless 0 0  Suicidal thoughts 0 0  PHQ-9 Score 0 0  Difficult doing work/chores Not difficult at all Not difficult at all      Fall Risk:    11/06/2022   10:16  AM 05/05/2022   10:19 AM  Fall Risk   Falls in the past year? 0 0  Number falls in past yr: 0 0  Injury with Fall? 0 0  Risk for fall due to : No Fall Risks No Fall Risks  Follow up Falls evaluation completed Falls evaluation completed      Functional Status Survey:      Assessment & Plan  Problem List Items Addressed This Visit       Cardiovascular and Mediastinum   Essential hypertension - Primary    Chronic, historic condition Appears well-managed with  current regimen comprised of lisinopril 40 mg p.o. daily and amlodipine 5 mg p.o. daily Reviewed blood pressure ranges at home which are close to goal, suspect she may have a bit of whitecoat hypertension in office today Continue current regimen-if blood pressure continues to remain elevated at subsequent visit may need to Increase amlodipine Follow-up in 3 months or sooner if concerns arise        Endocrine   Controlled type 2 diabetes mellitus with complication, without long-term current use of insulin (HCC)    Chronic, historic condition Patient is currently taking metformin 500 mg p.o. twice daily and appears to be tolerating well A1c today was 6.6 Continue current regimen We reviewed her blood glucose levels from home readings which appear to be within goal Refills provided for home testing supplies per request Follow-up in 3 months or sooner if concerns arise      Relevant Medications   Accu-Chek Softclix Lancets lancets   glucose blood (ACCU-CHEK GUIDE) test strip   Other Relevant Orders   Bayer DCA Hb A1c Waived (STAT) (Completed)   Hyperlipidemia associated with type 2 diabetes mellitus (HCC)    Chronic, historic condition Patient is currently taking rosuvastatin 5 mg p.o. daily and appears to be tolerating well Continue current regimen Recheck lipid panel in 3 months for monitoring Follow-up in 3 months or sooner if concerns arise        Return in about 3 months (around 02/06/2023) for HTN,  Diabetes follow up.   I, Paticia Moster E Erlinda Solinger, PA-C, have reviewed all documentation for this visit. The documentation on 11/09/22 for the exam, diagnosis, procedures, and orders are all accurate and complete.   Jacquelin Hawking, MHS, PA-C [facility]

## 2022-11-09 NOTE — Assessment & Plan Note (Signed)
Chronic, historic condition Appears well-managed with current regimen comprised of lisinopril 40 mg p.o. daily and amlodipine 5 mg p.o. daily Reviewed blood pressure ranges at home which are close to goal, suspect she may have a bit of whitecoat hypertension in office today Continue current regimen-if blood pressure continues to remain elevated at subsequent visit may need to Increase amlodipine Follow-up in 3 months or sooner if concerns arise

## 2022-11-09 NOTE — Assessment & Plan Note (Signed)
Chronic, historic condition Patient is currently taking rosuvastatin 5 mg p.o. daily and appears to be tolerating well Continue current regimen Recheck lipid panel in 3 months for monitoring Follow-up in 3 months or sooner if concerns arise

## 2022-11-09 NOTE — Assessment & Plan Note (Signed)
Chronic, historic condition Patient is currently taking metformin 500 mg p.o. twice daily and appears to be tolerating well A1c today was 6.6 Continue current regimen We reviewed her blood glucose levels from home readings which appear to be within goal Refills provided for home testing supplies per request Follow-up in 3 months or sooner if concerns arise

## 2023-02-09 NOTE — Progress Notes (Unsigned)
   There were no vitals taken for this visit.   Subjective:    Patient ID: Marie Curtis, female    DOB: Sep 17, 1955, 67 y.o.   MRN: [id number]  HPI: Marie Curtis is a 67 y.o. female  No chief complaint on file.  HYPERTENSION / HYPERLIPIDEMIA Satisfied with current treatment? yes Duration of hypertension: years BP monitoring frequency: not checking BP range:  BP medication side effects: no Past BP meds: lisinopril Duration of hyperlipidemia: years Cholesterol medication side effects: no Cholesterol supplements: none Past cholesterol medications: rosuvastatin (crestor) Medication compliance: excellent compliance Aspirin: no Recent stressors: no Recurrent headaches: no Visual changes: no Palpitations: no Dyspnea: no Chest pain: no Lower extremity edema: no Dizzy/lightheaded: no  DIABETES Hypoglycemic episodes:no Polydipsia/polyuria: no Visual disturbance: no Chest pain: no Paresthesias: no Glucose Monitoring: yes  Accucheck frequency: Daily  Fasting glucose: 120  Post prandial:  Evening:  Before meals: Taking Insulin?: no  Long acting insulin:  Short acting insulin: Blood Pressure Monitoring: not checking Retinal Examination: need to request records Foot Exam: Up to Date Diabetic Education: Not Completed Pneumovax: Up to Date Influenza: Up to Date Aspirin: no  Relevant past medical, surgical, family and social history reviewed and updated as indicated. Interim medical history since our last visit reviewed. Allergies and medications reviewed and updated.  Review of Systems  Per HPI unless specifically indicated above     Objective:    There were no vitals taken for this visit.  Wt Readings from Last 3 Encounters:  11/06/22 177 lb 6.4 oz (80.5 kg)  08/06/22 176 lb 6.4 oz (80 kg)  07/06/22 177 lb (80.3 kg)    Physical Exam  Results for orders placed or performed in visit on 11/06/22  Bayer DCA Hb A1c Waived (STAT)  Result  Value Ref Range   HB A1C (BAYER DCA - WAIVED) 6.6 (H) 4.8 - 5.6 %      Assessment & Plan:   Problem List Items Addressed This Visit       Cardiovascular and Mediastinum   Essential hypertension - Primary     Endocrine   Controlled type 2 diabetes mellitus with complication, without long-term current use of insulin (HCC)   Hyperlipidemia associated with type 2 diabetes mellitus (HCC)     Other   Obesity (BMI 30.0-34.9)     Follow up plan: No follow-ups on file.

## 2023-02-10 ENCOUNTER — Encounter: Payer: Self-pay | Admitting: Nurse Practitioner

## 2023-02-10 ENCOUNTER — Ambulatory Visit (INDEPENDENT_AMBULATORY_CARE_PROVIDER_SITE_OTHER): Payer: Medicare HMO | Admitting: Nurse Practitioner

## 2023-02-10 VITALS — BP 157/75 | HR 51 | Temp 98.0°F | Ht 60.4 in | Wt 176.2 lb

## 2023-02-10 DIAGNOSIS — E1169 Type 2 diabetes mellitus with other specified complication: Secondary | ICD-10-CM

## 2023-02-10 DIAGNOSIS — Z23 Encounter for immunization: Secondary | ICD-10-CM

## 2023-02-10 DIAGNOSIS — E118 Type 2 diabetes mellitus with unspecified complications: Secondary | ICD-10-CM | POA: Diagnosis not present

## 2023-02-10 DIAGNOSIS — E66811 Obesity, class 1: Secondary | ICD-10-CM | POA: Diagnosis not present

## 2023-02-10 DIAGNOSIS — I10 Essential (primary) hypertension: Secondary | ICD-10-CM | POA: Diagnosis not present

## 2023-02-10 DIAGNOSIS — E785 Hyperlipidemia, unspecified: Secondary | ICD-10-CM

## 2023-02-10 MED ORDER — METFORMIN HCL 500 MG PO TABS: 500.0000 mg | ORAL_TABLET | Freq: Two times a day (BID) | ORAL | 1 refills | Status: DC

## 2023-02-10 MED ORDER — ROSUVASTATIN CALCIUM 5 MG PO TABS: 5.0000 mg | ORAL_TABLET | Freq: Every day | ORAL | 1 refills | Status: DC

## 2023-02-10 MED ORDER — AMLODIPINE BESYLATE 5 MG PO TABS: 5.0000 mg | ORAL_TABLET | Freq: Every day | ORAL | 1 refills | Status: DC

## 2023-02-10 MED ORDER — LISINOPRIL 40 MG PO TABS: 40.0000 mg | ORAL_TABLET | Freq: Every day | ORAL | 1 refills | Status: DC

## 2023-02-10 NOTE — Assessment & Plan Note (Signed)
Chronic. Controlled.  Last A1c was 6.6% in July.  On Metformin 1000mg  BID. Will get eye exam results.  Patient started on Statin for cardio protection.  Labs ordered today. Will make recommendations based on lab results.

## 2023-02-10 NOTE — Assessment & Plan Note (Signed)
Chronic.  Controlled.  Continue with current medication regimen of Lisinopril 40mg  and Amlodipine 5mg .  Elevated at visit but she gets very nervous.  Checks blood pressures at home and runs 120/60-70.  Refills sent today.  Return to clinic in 6 months for reevaluation.  Call sooner if concerns arise.

## 2023-02-10 NOTE — Assessment & Plan Note (Signed)
Recommended eating smaller high protein, low fat meals more frequently and exercising 30 mins a day 5 times a week with a goal of 10-15lb weight loss in the next 3 months.  

## 2023-02-11 LAB — LIPID PANEL
Chol/HDL Ratio: 2.3 ratio (ref 0.0–4.4)
Cholesterol, Total: 136 mg/dL (ref 100–199)
HDL: 59 mg/dL (ref >39)
LDL Chol Calc (NIH): 54 mg/dL (ref 0–99)
Triglycerides: 136 mg/dL (ref 0–149)
VLDL Cholesterol Cal: 23 mg/dL (ref 5–40)

## 2023-02-11 LAB — COMPREHENSIVE METABOLIC PANEL
ALT: 13 [IU]/L (ref 0–32)
AST: 16 [IU]/L (ref 0–40)
Albumin: 4.6 g/dL (ref 3.9–4.9)
Alkaline Phosphatase: 83 [IU]/L (ref 44–121)
BUN/Creatinine Ratio: 20 (ref 12–28)
BUN: 18 mg/dL (ref 8–27)
Bilirubin Total: 0.5 mg/dL (ref 0.0–1.2)
CO2: 19 mmol/L — ABNORMAL LOW (ref 20–29)
Calcium: 9.4 mg/dL (ref 8.7–10.3)
Chloride: 105 mmol/L (ref 96–106)
Creatinine, Ser: 0.89 mg/dL (ref 0.57–1.00)
Globulin, Total: 3 g/dL (ref 1.5–4.5)
Glucose: 103 mg/dL — ABNORMAL HIGH (ref 70–99)
Potassium: 4.5 mmol/L (ref 3.5–5.2)
Sodium: 140 mmol/L (ref 134–144)
Total Protein: 7.6 g/dL (ref 6.0–8.5)
eGFR: 71 mL/min/{1.73_m2} (ref >59)

## 2023-02-11 LAB — HEMOGLOBIN A1C
Est. average glucose Bld gHb Est-mCnc: 151 mg/dL
Hgb A1c MFr Bld: 6.9 % — ABNORMAL HIGH (ref 4.8–5.6)

## 2023-02-11 NOTE — Progress Notes (Signed)
Please let patient know that overall her lab work looks good.  A1c did go up some to 6.9%.  I recommend continuing with medication regimen. I also recommend following a low carb diet. No other concerns at this time.  Follow up as discussed.

## 2023-06-07 ENCOUNTER — Other Ambulatory Visit: Payer: Self-pay | Admitting: Nurse Practitioner

## 2023-06-08 NOTE — Telephone Encounter (Signed)
Requested Prescriptions  Pending Prescriptions Disp Refills   rosuvastatin (CRESTOR) 5 MG tablet [Pharmacy Med Name: ROSUVASTATIN CALCIUM 5 MG TAB] 90 tablet 0    Sig: Take 1 tablet (5 mg total) by mouth daily.     Cardiovascular:  Antilipid - Statins 2 Failed - 06/08/2023  1:49 PM      Failed - Lipid Panel in normal range within the last 12 months    Cholesterol, Total  Date Value Ref Range Status  02/10/2023 136 100 - 199 mg/dL Final   LDL Chol Calc (NIH)  Date Value Ref Range Status  02/10/2023 54 0 - 99 mg/dL Final   HDL  Date Value Ref Range Status  02/10/2023 59 >39 mg/dL Final   Triglycerides  Date Value Ref Range Status  02/10/2023 136 0 - 149 mg/dL Final         Passed - Cr in normal range and within 360 days    Creatinine  Date Value Ref Range Status  10/29/2011 0.66 0.60 - 1.30 mg/dL Final   Creatinine, Ser  Date Value Ref Range Status  02/10/2023 0.89 0.57 - 1.00 mg/dL Final         Passed - Patient is not pregnant      Passed - Valid encounter within last 12 months    Recent Outpatient Visits           3 months ago Controlled type 2 diabetes mellitus with complication, without long-term current use of insulin (HCC)   [place] [facility] Larae Grooms, NP   7 months ago Essential hypertension   [place] [address] [facility] Mecum, Oswaldo Conroy, PA-C   10 months ago Essential hypertension   [place] [facility] Larae Grooms, NP   11 months ago Encounter for annual wellness exam in Medicare patient   [place] [facility] Larae Grooms, NP   1 year ago Essential hypertension   [place] [facility] Larae Grooms, NP       Future Appointments             In 2 months Larae Grooms, NP [place] [facility]

## 2023-06-30 LAB — HM DIABETES EYE EXAM

## 2023-07-20 ENCOUNTER — Other Ambulatory Visit: Payer: Self-pay | Admitting: Nurse Practitioner

## 2023-07-20 MED ORDER — METFORMIN HCL 500 MG PO TABS: 500.0000 mg | ORAL_TABLET | Freq: Two times a day (BID) | ORAL | 1 refills | Status: DC

## 2023-07-20 NOTE — Telephone Encounter (Signed)
 Prescription Request  07/20/2023  LOV: 02/10/2023  What is the name of the medication or equipment? metFORMIN (GLUCOPHAGE) 500 MG table   Have you contacted your pharmacy to request a refill? Yes   Which pharmacy would you like this sent to?  [facility] [place], [place] - [address] [place] [address] Phone: [phone] Fax: [phone]    Patient notified that their request is being sent to the clinical staff for review and that they should receive a response within 2 business days.   Please advise at Mobile [phone] (mobile)

## 2023-07-27 ENCOUNTER — Other Ambulatory Visit: Payer: Self-pay | Admitting: Physician Assistant

## 2023-07-27 ENCOUNTER — Telehealth: Payer: Self-pay | Admitting: Nurse Practitioner

## 2023-07-27 DIAGNOSIS — E118 Type 2 diabetes mellitus with unspecified complications: Secondary | ICD-10-CM

## 2023-07-27 MED ORDER — METFORMIN HCL 500 MG PO TABS: 500.0000 mg | ORAL_TABLET | Freq: Two times a day (BID) | ORAL | 1 refills | Status: DC

## 2023-07-27 NOTE — Telephone Encounter (Signed)
 Medication sent to the pharmacy.

## 2023-07-27 NOTE — Telephone Encounter (Signed)
 Patient came into the office ans states that she needs a refill on metformin please advise.

## 2023-07-28 NOTE — Telephone Encounter (Signed)
 Requested medication (s) are due for refill today:yes  Requested medication (s) are on the active medication list: yes  Last refill:  11/06/22 100 each 1 RF  Future visit scheduled: yes  Notes to clinic:  no protocol for this item   Requested Prescriptions  Pending Prescriptions Disp Refills   ACCU-CHEK GUIDE TEST test strip [Pharmacy Med Name: ACCU-CHEK GUIDE TEST STRIP 100] 100 strip 0    Sig: 1 each by Other route daily.     There is no refill protocol information for this order

## 2023-07-30 ENCOUNTER — Telehealth: Payer: Self-pay | Admitting: Nurse Practitioner

## 2023-07-30 NOTE — Telephone Encounter (Signed)
 Prescription Request  07/30/2023  LOV: 02/10/2023  What is the name of the medication or equipment? glucose blood (ACCU-CHEK GUIDE TEST) test strip   Have you contacted your pharmacy to request a refill? Yes   Which pharmacy would you like this sent to?  [facility] - [place], [place] - [address] [place] [address] Phone: [phone] Fax: [phone]    Patient notified that their request is being sent to the clinical staff for review and that they should receive a response within 2 business days.   Please advise at Mobile [phone] (mobile)

## 2023-07-30 NOTE — Telephone Encounter (Signed)
 Prescription sent in 2 days ago. Called [employer] and confirmed this was received. RX picked up this morning per the pharmacy.

## 2023-08-11 ENCOUNTER — Encounter: Payer: Self-pay | Admitting: Nurse Practitioner

## 2023-08-11 ENCOUNTER — Ambulatory Visit: Payer: Self-pay | Admitting: Nurse Practitioner

## 2023-08-11 VITALS — BP 132/71 | HR 49 | Temp 97.9°F | Wt 173.2 lb

## 2023-08-11 DIAGNOSIS — E118 Type 2 diabetes mellitus with unspecified complications: Secondary | ICD-10-CM | POA: Diagnosis not present

## 2023-08-11 DIAGNOSIS — E785 Hyperlipidemia, unspecified: Secondary | ICD-10-CM

## 2023-08-11 DIAGNOSIS — E119 Type 2 diabetes mellitus without complications: Secondary | ICD-10-CM | POA: Diagnosis not present

## 2023-08-11 DIAGNOSIS — Z0000 Encounter for general adult medical examination without abnormal findings: Secondary | ICD-10-CM

## 2023-08-11 DIAGNOSIS — I10 Essential (primary) hypertension: Secondary | ICD-10-CM | POA: Diagnosis not present

## 2023-08-11 DIAGNOSIS — Z7984 Long term (current) use of oral hypoglycemic drugs: Secondary | ICD-10-CM

## 2023-08-11 DIAGNOSIS — E66811 Obesity, class 1: Secondary | ICD-10-CM

## 2023-08-11 DIAGNOSIS — E1169 Type 2 diabetes mellitus with other specified complication: Secondary | ICD-10-CM

## 2023-08-11 DIAGNOSIS — Z7189 Other specified counseling: Secondary | ICD-10-CM | POA: Diagnosis not present

## 2023-08-11 DIAGNOSIS — Z794 Long term (current) use of insulin: Secondary | ICD-10-CM

## 2023-08-11 LAB — MICROALBUMIN, URINE WAIVED
Creatinine, Urine Waived: 200 mg/dL (ref 10–300)
Microalb, Ur Waived: 30 mg/L — ABNORMAL HIGH (ref 0–19)
Microalb/Creat Ratio: <30 mg/g (ref <30)

## 2023-08-11 MED ORDER — ROSUVASTATIN CALCIUM 5 MG PO TABS
5.0000 mg | ORAL_TABLET | Freq: Every day | ORAL | 1 refills | Status: AC
Start: 2023-08-11 — End: ?

## 2023-08-11 MED ORDER — ACCU-CHEK GUIDE TEST VI STRP
ORAL_STRIP | 3 refills | Status: AC
Start: 2023-08-11 — End: ?

## 2023-08-11 MED ORDER — LISINOPRIL 40 MG PO TABS: 40.0000 mg | ORAL_TABLET | Freq: Every day | ORAL | 1 refills | Status: DC

## 2023-08-11 MED ORDER — METFORMIN HCL 500 MG PO TABS: 500.0000 mg | ORAL_TABLET | Freq: Two times a day (BID) | ORAL | 1 refills | Status: DC

## 2023-08-11 MED ORDER — AMLODIPINE BESYLATE 5 MG PO TABS
5.0000 mg | ORAL_TABLET | Freq: Every day | ORAL | 1 refills | Status: DC
Start: 2023-08-11 — End: 2024-02-10

## 2023-08-11 NOTE — Progress Notes (Signed)
 BP 132/71   Pulse (!) 49   Temp 97.9 F (36.6 C) (Oral)   Wt 173 lb 3.2 oz (78.6 kg)   SpO2 97%   BMI 33.38 kg/m    Subjective:    Patient ID: Marie Curtis, female    DOB: 04/19/1956, 68 y.o.   MRN: [id number]  HPI: Marie Curtis is a 68 y.o. female presenting on 08/11/2023 for comprehensive medical examination. Current medical complaints include: medication refills  She currently lives with: Menopausal Symptoms: no  HYPERTENSION / HYPERLIPIDEMIA Satisfied with current treatment? yes Duration of hypertension: years BP monitoring frequency: daily BP range: 125-130/80 BP medication side effects: no Past BP meds: lisinopril  Duration of hyperlipidemia: years Cholesterol medication side effects: no Cholesterol supplements: none Past cholesterol medications: rosuvastatin  (crestor ) Medication compliance: excellent compliance Aspirin: no Recent stressors: no Recurrent headaches: no Visual changes: no Palpitations: no Dyspnea: no Chest pain: no Lower extremity edema: no Dizzy/lightheaded: no  DIABETES Hypoglycemic episodes:no Polydipsia/polyuria: no Visual disturbance: no Chest pain: no Paresthesias: no Glucose Monitoring: yes  Accucheck frequency: Daily  Fasting glucose: 118-125  Post prandial:  Evening:  Before meals: Taking Insulin ?: no  Long acting insulin :  Short acting insulin : Blood Pressure Monitoring: not checking Retinal Examination: need to request records Foot Exam: Up to Date Diabetic Education: Not Completed Pneumovax: Up to Date Influenza: Up to Date Aspirin: no  Depression Screen done today and results listed below:     08/11/2023   11:04 AM 02/10/2023   10:57 AM 11/06/2022   10:16 AM 05/05/2022   10:19 AM  Depression screen PHQ 2/9  Decreased Interest 0 0 0 0  Down, Depressed, Hopeless 0 0 0 0  PHQ - 2 Score 0 0 0 0  Altered sleeping 0 0 0 0  Tired, decreased energy 0 0 0 0  Change in appetite 0 0 0 0  Feeling  bad or failure about yourself  0 0 0 0  Trouble concentrating 0 0 0 0  Moving slowly or fidgety/restless 0 0 0 0  Suicidal thoughts 0 0 0 0  PHQ-9 Score 0 0 0 0  Difficult doing work/chores Not difficult at all  Not difficult at all Not difficult at all    The patient does not have a history of falls. I did complete a risk assessment for falls. A plan of care for falls was documented.   Past Medical History:  Past Medical History:  Diagnosis Date   Diabetes mellitus without complication (HCC)    Headache(784.0)    Hypertension     Surgical History:  Past Surgical History:  Procedure Laterality Date   CHOLECYSTECTOMY  05/06/2012   Procedure: LAPAROSCOPIC CHOLECYSTECTOMY;  Surgeon: Shela Derby, MD;  Location: [facility] OR;  Service: General;  Laterality: N/A;   NO PAST SURGERIES      Medications:  Current Outpatient Medications on File Prior to Visit  Medication Sig   Accu-Chek Softclix Lancets lancets 1 each by Other route daily.   blood glucose meter kit and supplies KIT Dispense based on patient and insurance preference. Use up to four times daily as directed.   No current facility-administered medications on file prior to visit.    Allergies:  No Known Allergies  Social History:  Social History   Socioeconomic History   Marital status: Married    Spouse name: Not on file   Number of children: Not on file   Years of education: Not on file   Highest education level: Not on  file  Occupational History   Not on file  Tobacco Use   Smoking status: Never   Smokeless tobacco: Never  Vaping Use   Vaping status: Never Used  Substance and Sexual Activity   Alcohol use: No   Drug use: No   Sexual activity: Not on file  Other Topics Concern   Not on file  Social History Narrative   Married. Two children, 7 grandchildren. Lives with husband. Works in Animal nutritionist.   Social Drivers of Corporate investment banker Strain: Not on file  Food Insecurity: No Food  Insecurity (08/11/2023)   Hunger Vital Sign    Worried About Running Out of Food in the Last Year: Never true    Ran Out of Food in the Last Year: Never true  Transportation Needs: No Transportation Needs (08/11/2023)   PRAPARE - Administrator, Civil Service (Medical): No    Lack of Transportation (Non-Medical): No  Physical Activity: Not on file  Stress: Not on file  Social Connections: Unknown (08/11/2023)   Social Connection and Isolation Panel [NHANES]    Frequency of Communication with Friends and Family: Once a week    Frequency of Social Gatherings with Friends and Family: Once a week    Attends Religious Services: Not on Marketing executive or Organizations: Not on file    Attends Banker Meetings: Not on file    Marital Status: Not on file  Intimate Partner Violence: Not At Risk (08/11/2023)   Humiliation, Afraid, Rape, and Kick questionnaire    Fear of Current or Ex-Partner: No    Emotionally Abused: No    Physically Abused: No    Sexually Abused: No   Social History   Tobacco Use  Smoking Status Never  Smokeless Tobacco Never   Social History   Substance and Sexual Activity  Alcohol Use No    Family History:  History reviewed. No pertinent family history.  Past medical history, surgical history, medications, allergies, family history and social history reviewed with patient today and changes made to appropriate areas of the chart.   Review of Systems  HENT:         Denies vision changes.  Eyes:  Negative for blurred vision and double vision.  Respiratory:  Negative for shortness of breath.   Cardiovascular:  Negative for chest pain, palpitations and leg swelling.  Neurological:  Negative for dizziness, tingling and headaches.  Endo/Heme/Allergies:  Negative for polydipsia.       Denies Polyuria   All other ROS negative except what is listed above and in the HPI.      Objective:    BP 132/71   Pulse (!) 49   Temp  97.9 F (36.6 C) (Oral)   Wt 173 lb 3.2 oz (78.6 kg)   SpO2 97%   BMI 33.38 kg/m   Wt Readings from Last 3 Encounters:  08/11/23 173 lb 3.2 oz (78.6 kg)  02/10/23 176 lb 3.2 oz (79.9 kg)  11/06/22 177 lb 6.4 oz (80.5 kg)    Physical Exam Vitals and nursing note reviewed.  Constitutional:      General: She is awake. She is not in acute distress.    Appearance: Normal appearance. She is well-developed. She is obese. She is not ill-appearing.  HENT:     Head: Normocephalic and atraumatic.     Right Ear: Hearing, tympanic membrane, ear canal and external ear normal. No drainage.  Left Ear: Hearing, tympanic membrane, ear canal and external ear normal. No drainage.     Nose: Nose normal.     Right Sinus: No maxillary sinus tenderness or frontal sinus tenderness.     Left Sinus: No maxillary sinus tenderness or frontal sinus tenderness.     Mouth/Throat:     Mouth: Mucous membranes are moist.     Pharynx: Oropharynx is clear. Uvula midline. No pharyngeal swelling, oropharyngeal exudate or posterior oropharyngeal erythema.  Eyes:     General: Lids are normal.        Right eye: No discharge.        Left eye: No discharge.     Extraocular Movements: Extraocular movements intact.     Conjunctiva/sclera: Conjunctivae normal.     Pupils: Pupils are equal, round, and reactive to light.     Visual Fields: Right eye visual fields normal and left eye visual fields normal.  Neck:     Thyroid : No thyromegaly.     Vascular: No carotid bruit.     Trachea: Trachea normal.  Cardiovascular:     Rate and Rhythm: Normal rate and regular rhythm.     Heart sounds: Normal heart sounds. No murmur heard.    No gallop.  Pulmonary:     Effort: Pulmonary effort is normal. No accessory muscle usage or respiratory distress.     Breath sounds: Normal breath sounds.  Chest:  Breasts:    Right: Normal.     Left: Normal.  Abdominal:     General: Bowel sounds are normal.     Palpations: Abdomen is  soft. There is no hepatomegaly or splenomegaly.     Tenderness: There is no abdominal tenderness.  Musculoskeletal:        General: Normal range of motion.     Cervical back: Normal range of motion and neck supple.     Right lower leg: No edema.     Left lower leg: No edema.  Lymphadenopathy:     Head:     Right side of head: No submental, submandibular, tonsillar, preauricular or posterior auricular adenopathy.     Left side of head: No submental, submandibular, tonsillar, preauricular or posterior auricular adenopathy.     Cervical: No cervical adenopathy.     Upper Body:     Right upper body: No supraclavicular, axillary or pectoral adenopathy.     Left upper body: No supraclavicular, axillary or pectoral adenopathy.  Skin:    General: Skin is warm and dry.     Capillary Refill: Capillary refill takes less than 2 seconds.     Findings: No rash.  Neurological:     Mental Status: She is alert and oriented to person, place, and time.     Gait: Gait is intact.  Psychiatric:        Attention and Perception: Attention normal.        Mood and Affect: Mood normal.        Speech: Speech normal.        Behavior: Behavior normal. Behavior is cooperative.        Thought Content: Thought content normal.        Judgment: Judgment normal.     Results for orders placed or performed in visit on 06/30/23  HM DIABETES EYE EXAM   Collection Time: 06/30/23 12:24 PM  Result Value Ref Range   HM Diabetic Eye Exam No Retinopathy No Retinopathy      Assessment & Plan:   Problem List Items  Addressed This Visit       Cardiovascular and Mediastinum   Essential hypertension   Chronic.  Controlled.  Continue with current medication regimen of Lisinopril  40mg  and Amlodipine  5mg .  Elevated at visit but she gets very nervous.  Checks blood pressures at home and runs 120-130/60-70.  Refills sent today.  Return to clinic in 6 months for reevaluation.  Call sooner if concerns arise.       Relevant  Medications   amLODipine  (NORVASC ) 5 MG tablet   lisinopril  (ZESTRIL ) 40 MG tablet   rosuvastatin  (CRESTOR ) 5 MG tablet     Endocrine   Controlled type 2 diabetes mellitus with complication, without long-term current use of insulin  (HCC)   Chronic. Controlled.  Last A1c was 6.9%.  On Metformin  1000mg  BID. Will get eye exam results.  Microalbumin updated today.  Labs ordered today. Will make recommendations based on lab results.       Relevant Medications   glucose blood (ACCU-CHEK GUIDE TEST) test strip   lisinopril  (ZESTRIL ) 40 MG tablet   metFORMIN  (GLUCOPHAGE ) 500 MG tablet   rosuvastatin  (CRESTOR ) 5 MG tablet   Other Relevant Orders   Hemoglobin A1c   Microalbumin, Urine Waived   Hyperlipidemia associated with type 2 diabetes mellitus (HCC)   Chronic, historic condition Patient is currently taking rosuvastatin  5 mg p.o. daily and appears to be tolerating well Continue current regimen Labs ordered today Follow-up in 6 months or sooner if concerns arise      Relevant Medications   amLODipine  (NORVASC ) 5 MG tablet   lisinopril  (ZESTRIL ) 40 MG tablet   metFORMIN  (GLUCOPHAGE ) 500 MG tablet   rosuvastatin  (CRESTOR ) 5 MG tablet   Other Relevant Orders   Lipid panel   Diabetes mellitus treated with insulin  and oral medication (HCC)   Relevant Medications   lisinopril  (ZESTRIL ) 40 MG tablet   metFORMIN  (GLUCOPHAGE ) 500 MG tablet   rosuvastatin  (CRESTOR ) 5 MG tablet     Other   Obesity (BMI 30.0-34.9)   Recommended eating smaller high protein, low fat meals more frequently and exercising 30 mins a day 5 times a week with a goal of 10-15lb weight loss in the next 3 months.       Advanced care planning/counseling discussion   A voluntary discussion about advance care planning including the explanation and discussion of advance directives was extensively discussed  with the patient for 5 minutes with patient and her husband present.  Explanation about the health care proxy and  Living will was reviewed and packet with forms with explanation of how to fill them out was given.  During this discussion, the patient was able to identify a health care proxy as her husband and plans to fill out the paperwork required.  Patient was offered a separate Advance Care Planning visit for further assistance with forms.         Other Visit Diagnoses       Annual physical exam    -  Primary   Health maintenance reviewed during visit today. Labs ordered.  Vaccines discussed.  Declined Cologuard.  Mammogram up to date.   Relevant Orders   CBC with Differential/Platelet   Comprehensive metabolic panel with GFR   Lipid panel   TSH   Hemoglobin A1c     Encounter for Medicare annual wellness exam            Follow up plan: Return in about 6 months (around 02/10/2024) for HTN, HLD, DM2 FU.   LABORATORY TESTING:  -  Pap smear: not applicable  IMMUNIZATIONS:   - Tdap: Tetanus vaccination status reviewed: Medicare. - Influenza: Up to date - Pneumovax: Up to date - Prevnar: Up to date - COVID: Up to date - HPV: Not applicable - Shingrix vaccine:  Discussed at visit today  SCREENING: -Mammogram: Up to date  - Colonoscopy: Refused  - Bone Density: Up to date  -Hearing Test: Not applicable  -Spirometry: Not applicable   PATIENT COUNSELING:   Advised to take 1 mg of folate supplement per day if capable of pregnancy.   Sexuality: Discussed sexually transmitted diseases, partner selection, use of condoms, avoidance of unintended pregnancy  and contraceptive alternatives.   Advised to avoid cigarette smoking.  I discussed with the patient that most people either abstain from alcohol or drink within safe limits (<=14/week and <=4 drinks/occasion for males, <=7/weeks and <= 3 drinks/occasion for females) and that the risk for alcohol disorders and other health effects rises proportionally with the number of drinks per week and how often a drinker exceeds daily  limits.  Discussed cessation/primary prevention of drug use and availability of treatment for abuse.   Diet: Encouraged to adjust caloric intake to maintain  or achieve ideal body weight, to reduce intake of dietary saturated fat and total fat, to limit sodium intake by avoiding high sodium foods and not adding table salt, and to maintain adequate dietary potassium and calcium  preferably from fresh fruits, vegetables, and low-fat dairy products.    stressed the importance of regular exercise  Injury prevention: Discussed safety belts, safety helmets, smoke detector, smoking near bedding or upholstery.   Dental health: Discussed importance of regular tooth brushing, flossing, and dental visits.    NEXT PREVENTATIVE PHYSICAL DUE IN 1 YEAR. Return in about 6 months (around 02/10/2024) for HTN, HLD, DM2 FU.

## 2023-08-11 NOTE — Assessment & Plan Note (Signed)
 A voluntary discussion about advance care planning including the explanation and discussion of advance directives was extensively discussed  with the patient for 5 minutes with patient and her husband present.  Explanation about the health care proxy and Living will was reviewed and packet with forms with explanation of how to fill them out was given.  During this discussion, the patient was able to identify a health care proxy as her husband and plans to fill out the paperwork required.  Patient was offered a separate Advance Care Planning visit for further assistance with forms.

## 2023-08-11 NOTE — Assessment & Plan Note (Signed)
 Chronic. Controlled.  Last A1c was 6.9%.  On Metformin  1000mg  BID. Will get eye exam results.  Microalbumin updated today.  Labs ordered today. Will make recommendations based on lab results.

## 2023-08-11 NOTE — Assessment & Plan Note (Signed)
 Chronic.  Controlled.  Continue with current medication regimen of Lisinopril  40mg  and Amlodipine  5mg .  Elevated at visit but she gets very nervous.  Checks blood pressures at home and runs 120-130/60-70.  Refills sent today.  Return to clinic in 6 months for reevaluation.  Call sooner if concerns arise.

## 2023-08-11 NOTE — Assessment & Plan Note (Signed)
 Chronic, historic condition Patient is currently taking rosuvastatin  5 mg p.o. daily and appears to be tolerating well Continue current regimen Labs ordered today Follow-up in 6 months or sooner if concerns arise

## 2023-08-11 NOTE — Assessment & Plan Note (Signed)
 Recommended eating smaller high protein, low fat meals more frequently and exercising 30 mins a day 5 times a week with a goal of 10-15lb weight loss in the next 3 months.

## 2023-08-11 NOTE — Progress Notes (Signed)
 Subjective:   Marie Curtis is a 68 y.o. female who presents for Medicare Annual (Subsequent) preventive examination.  Visit Complete: In person  Patient Medicare AWV questionnaire was completed by the patient on 08/14/2023; I have confirmed that all information answered by patient is correct and no changes since this date.        Objective:    Today's Vitals   08/11/23 1056 08/11/23 1105  BP: (!) 166/72 132/71  Pulse: (!) 51 (!) 49  Temp: 97.9 F (36.6 C)   TempSrc: Oral   SpO2: 97%   Weight: 173 lb 3.2 oz (78.6 kg)   PainSc: 0-No pain    Body mass index is 33.38 kg/m.     05/16/2021    9:10 AM 11/19/2018   11:02 PM 11/19/2018    5:17 PM 10/28/2018    6:29 PM 05/06/2012    7:00 PM  Advanced Directives  Does Patient Have a Medical Advance Directive? No No No No Patient does not have advance directive;Patient would not like information  Would patient like information on creating a medical advance directive? No - Patient declined No - Patient declined No - Patient declined    Pre-existing out of facility DNR order (yellow form or pink MOST form)     No    Current Medications (verified) Outpatient Encounter Medications as of 08/11/2023  Medication Sig   Accu-Chek Softclix Lancets lancets 1 each by Other route daily.   blood glucose meter kit and supplies KIT Dispense based on patient and insurance preference. Use up to four times daily as directed.   [DISCONTINUED] amLODipine  (NORVASC ) 5 MG tablet Take 1 tablet (5 mg total) by mouth daily.   [DISCONTINUED] glucose blood (ACCU-CHEK GUIDE TEST) test strip 1 each by Other route daily.   [DISCONTINUED] lisinopril  (ZESTRIL ) 40 MG tablet Take 1 tablet (40 mg total) by mouth daily.   [DISCONTINUED] metFORMIN  (GLUCOPHAGE ) 500 MG tablet Take 1 tablet (500 mg total) by mouth 2 (two) times daily with a meal.   [DISCONTINUED] rosuvastatin  (CRESTOR ) 5 MG tablet Take 1 tablet (5 mg total) by mouth daily.   amLODipine  (NORVASC )  5 MG tablet Take 1 tablet (5 mg total) by mouth daily.   glucose blood (ACCU-CHEK GUIDE TEST) test strip Use as instructed   lisinopril  (ZESTRIL ) 40 MG tablet Take 1 tablet (40 mg total) by mouth daily.   metFORMIN  (GLUCOPHAGE ) 500 MG tablet Take 1 tablet (500 mg total) by mouth 2 (two) times daily with a meal.   rosuvastatin  (CRESTOR ) 5 MG tablet Take 1 tablet (5 mg total) by mouth daily.   No facility-administered encounter medications on file as of 08/11/2023.    Allergies (verified) Patient has no known allergies.   History: Past Medical History:  Diagnosis Date   Diabetes mellitus without complication (HCC)    Headache(784.0)    Hypertension    Past Surgical History:  Procedure Laterality Date   CHOLECYSTECTOMY  05/06/2012   Procedure: LAPAROSCOPIC CHOLECYSTECTOMY;  Surgeon: Shela Derby, MD;  Location: [facility] OR;  Service: General;  Laterality: N/A;   NO PAST SURGERIES     History reviewed. No pertinent family history. Social History   Socioeconomic History   Marital status: Married    Spouse name: Not on file   Number of children: Not on file   Years of education: Not on file   Highest education level: Not on file  Occupational History   Not on file  Tobacco Use   Smoking status: Never  Smokeless tobacco: Never  Vaping Use   Vaping status: Never Used  Substance and Sexual Activity   Alcohol use: No   Drug use: No   Sexual activity: Not on file  Other Topics Concern   Not on file  Social History Narrative   Married. Two children, 7 grandchildren. Lives with husband. Works in Animal nutritionist.   Social Drivers of Corporate investment banker Strain: Not on file  Food Insecurity: Not on file  Transportation Needs: Not on file  Physical Activity: Not on file  Stress: Not on file  Social Connections: Not on file    Tobacco Counseling Counseling given: Not Answered   Clinical Intake:     Pain Score: 0-No pain                   Activities of Daily Living     No data to display           Patient Care Team: Aileen Alexanders, NP as PCP - General (Nurse Practitioner)  Indicate any recent Medical Services you may have received from other than [facility] providers in the past year (date may be approximate).     Assessment:   This is a routine wellness examination for Marie Curtis.  Hearing/Vision screen No results found.   Goals Addressed   None   Depression Screen    08/11/2023   11:04 AM 02/10/2023   10:57 AM 11/06/2022   10:16 AM 05/05/2022   10:19 AM  PHQ 2/9 Scores  PHQ - 2 Score 0 0 0 0  PHQ- 9 Score 0 0 0 0    Fall Risk    08/11/2023   11:04 AM 11/06/2022   10:16 AM 05/05/2022   10:19 AM  Fall Risk   Falls in the past year? 0 0 0  Number falls in past yr: 0 0 0  Injury with Fall? 0 0 0  Risk for fall due to : No Fall Risks No Fall Risks No Fall Risks  Follow up Falls evaluation completed Falls evaluation completed Falls evaluation completed    MEDICARE RISK AT HOME:    TIMED UP AND GO:  Was the test performed?  No    Cognitive Function:        08/11/2023   11:24 AM  6CIT Screen  What Year? 0 points  What month? 0 points  What time? 0 points  Count back from 20 0 points  Months in reverse 0 points  Repeat phrase 0 points  Total Score 0 points        Immunizations Immunization History  Administered Date(s) Administered   Fluad Quad(high Dose 65+) 04/01/2022   Fluad Trivalent(High Dose 65+) 02/10/2023   PFIZER(Purple Top)SARS-COV-2 Vaccination 06/03/2019, 06/24/2019   PNEUMOCOCCAL CONJUGATE-20 04/01/2022    TDAP status: Due, Education has been provided regarding the importance of this vaccine. Advised may receive this vaccine at local pharmacy or Health Dept. Aware to provide a copy of the vaccination record if obtained from local pharmacy or Health Dept. Verbalized acceptance and understanding.  Flu Vaccine status: Up to date  Pneumococcal vaccine status: Up to  date  Covid-19 vaccine status: Completed vaccines  Qualifies for Shingles Vaccine? Yes   Zostavax completed No   Shingrix Completed?: No.    Education has been provided regarding the importance of this vaccine. Patient has been advised to call insurance company to determine out of pocket expense if they have not yet received this vaccine. Advised may also receive  vaccine at local pharmacy or Health Dept. Verbalized acceptance and understanding.  Screening Tests Health Maintenance  Topic Date Due   DTaP/Tdap/Td (1 - Tdap) Never done   Fecal DNA (Cologuard)  Never done   Zoster Vaccines- Shingrix (1 of 2) Never done   COVID-19 Vaccine (3 - 2024-25 season) 12/20/2022   Diabetic kidney evaluation - Urine ACR  04/02/2023   HEMOGLOBIN A1C  08/11/2023   INFLUENZA VACCINE  11/19/2023   Diabetic kidney evaluation - eGFR measurement  02/10/2024   OPHTHALMOLOGY EXAM  06/29/2024   FOOT EXAM  08/10/2024   Medicare Annual Wellness (AWV)  08/10/2024   MAMMOGRAM  08/18/2024   Pneumonia Vaccine 26+ Years old  Completed   DEXA SCAN  Completed   Hepatitis C Screening  Completed   HPV VACCINES  Aged Out   Meningococcal B Vaccine  Aged Out    Health Maintenance  Health Maintenance Due  Topic Date Due   DTaP/Tdap/Td (1 - Tdap) Never done   Fecal DNA (Cologuard)  Never done   Zoster Vaccines- Shingrix (1 of 2) Never done   COVID-19 Vaccine (3 - 2024-25 season) 12/20/2022   Diabetic kidney evaluation - Urine ACR  04/02/2023   HEMOGLOBIN A1C  08/11/2023    Colorectal cancer screening: No longer required. - Declined  Mammogram status: Completed 2024. Repeat every year    Additional Screening:  Hepatitis C Screening: does qualify; Completed    Dental Screening: Recommended annual dental exams for proper oral hygiene  Diabetic Foot Exam: Diabetic Foot Exam: Completed 08/11/23  Community Resource Referral / Chronic Care Management: CRR required this visit?  No   CCM required this visit?   No     Plan:     I have personally reviewed and noted the following in the patient's chart:   Medical and social history Use of alcohol, tobacco or illicit drugs  Current medications and supplements including opioid prescriptions. Patient is not currently taking opioid prescriptions. Functional ability and status Nutritional status Physical activity Advanced directives List of other physicians Hospitalizations, surgeries, and ER visits in previous 12 months Vitals Screenings to include cognitive, depression, and falls Referrals and appointments  In addition, I have reviewed and discussed with patient certain preventive protocols, quality metrics, and best practice recommendations. A written personalized care plan for preventive services as well as general preventive health recommendations were provided to patient.     Nasario Badder, CMA   08/11/2023   After Visit Summary: (In Person-Declined) Patient declined AVS at this time.

## 2023-08-13 LAB — CBC WITH DIFFERENTIAL/PLATELET
Basophils Absolute: 0.1 10*3/uL (ref 0.0–0.2)
Basos: 1 %
EOS (ABSOLUTE): 0.2 10*3/uL (ref 0.0–0.4)
Eos: 3 %
Hematocrit: 41.8 % (ref 34.0–46.6)
Hemoglobin: 13.4 g/dL (ref 11.1–15.9)
Immature Grans (Abs): 0 10*3/uL (ref 0.0–0.1)
Immature Granulocytes: 0 %
Lymphocytes Absolute: 2.2 10*3/uL (ref 0.7–3.1)
Lymphs: 32 %
MCH: 29.6 pg (ref 26.6–33.0)
MCHC: 32.1 g/dL (ref 31.5–35.7)
MCV: 93 fL (ref 79–97)
Monocytes Absolute: 0.4 10*3/uL (ref 0.1–0.9)
Monocytes: 5 %
Neutrophils Absolute: 4 10*3/uL (ref 1.4–7.0)
Neutrophils: 59 %
Platelets: 182 10*3/uL (ref 150–450)
RBC: 4.52 x10E6/uL (ref 3.77–5.28)
RDW: 13.9 % (ref 11.7–15.4)
WBC: 6.8 10*3/uL (ref 3.4–10.8)

## 2023-08-13 LAB — LIPID PANEL
Chol/HDL Ratio: 2.5 ratio (ref 0.0–4.4)
Cholesterol, Total: 150 mg/dL (ref 100–199)
HDL: 59 mg/dL (ref >39)
LDL Chol Calc (NIH): 69 mg/dL (ref 0–99)
Triglycerides: 129 mg/dL (ref 0–149)
VLDL Cholesterol Cal: 22 mg/dL (ref 5–40)

## 2023-08-13 LAB — COMPREHENSIVE METABOLIC PANEL WITH GFR
ALT: 12 IU/L (ref 0–32)
AST: 16 IU/L (ref 0–40)
Albumin: 4.6 g/dL (ref 3.9–4.9)
Alkaline Phosphatase: 94 IU/L (ref 44–121)
BUN/Creatinine Ratio: 20 (ref 12–28)
BUN: 17 mg/dL (ref 8–27)
Bilirubin Total: 0.5 mg/dL (ref 0.0–1.2)
CO2: 19 mmol/L — ABNORMAL LOW (ref 20–29)
Calcium: 9.1 mg/dL (ref 8.7–10.3)
Chloride: 103 mmol/L (ref 96–106)
Creatinine, Ser: 0.87 mg/dL (ref 0.57–1.00)
Globulin, Total: 2.6 g/dL (ref 1.5–4.5)
Glucose: 101 mg/dL — ABNORMAL HIGH (ref 70–99)
Potassium: 4.3 mmol/L (ref 3.5–5.2)
Sodium: 139 mmol/L (ref 134–144)
Total Protein: 7.2 g/dL (ref 6.0–8.5)
eGFR: 73 mL/min/{1.73_m2} (ref >59)

## 2023-08-13 LAB — HEMOGLOBIN A1C
Est. average glucose Bld gHb Est-mCnc: 143 mg/dL
Hgb A1c MFr Bld: 6.6 % — ABNORMAL HIGH (ref 4.8–5.6)

## 2023-08-13 LAB — TSH: TSH: 2.32 u[IU]/mL (ref 0.450–4.500)

## 2023-10-04 ENCOUNTER — Telehealth: Payer: Self-pay | Admitting: Nurse Practitioner

## 2023-10-04 MED ORDER — ROSUVASTATIN CALCIUM 5 MG PO TABS: 5.0000 mg | ORAL_TABLET | Freq: Every day | ORAL | 1 refills | Status: DC

## 2023-10-04 NOTE — Telephone Encounter (Signed)
 Patient's husband came in to request a refill on rosuvastatin .

## 2023-12-31 DIAGNOSIS — Z79899 Other long term (current) drug therapy: Secondary | ICD-10-CM | POA: Diagnosis not present

## 2023-12-31 DIAGNOSIS — B372 Candidiasis of skin and nail: Secondary | ICD-10-CM | POA: Diagnosis not present

## 2024-02-10 ENCOUNTER — Ambulatory Visit
Admission: RE | Admit: 2024-02-10 | Discharge: 2024-02-10 | Disposition: A | Discharge status: Home / Self Care | Source: Ambulatory Visit | Attending: Nurse Practitioner | Admitting: Nurse Practitioner

## 2024-02-10 ENCOUNTER — Encounter: Payer: Self-pay | Admitting: Nurse Practitioner

## 2024-02-10 ENCOUNTER — Ambulatory Visit: Admitting: Nurse Practitioner

## 2024-02-10 VITALS — BP 129/63 | HR 57 | Temp 98.0°F | Ht 60.2 in | Wt 172.4 lb

## 2024-02-10 DIAGNOSIS — E118 Type 2 diabetes mellitus with unspecified complications: Secondary | ICD-10-CM | POA: Diagnosis not present

## 2024-02-10 DIAGNOSIS — I10 Essential (primary) hypertension: Secondary | ICD-10-CM

## 2024-02-10 DIAGNOSIS — E119 Type 2 diabetes mellitus without complications: Secondary | ICD-10-CM | POA: Diagnosis not present

## 2024-02-10 DIAGNOSIS — Z7984 Long term (current) use of oral hypoglycemic drugs: Secondary | ICD-10-CM | POA: Diagnosis not present

## 2024-02-10 DIAGNOSIS — Z23 Encounter for immunization: Secondary | ICD-10-CM | POA: Diagnosis not present

## 2024-02-10 DIAGNOSIS — Z794 Long term (current) use of insulin: Secondary | ICD-10-CM | POA: Diagnosis not present

## 2024-02-10 DIAGNOSIS — M25511 Pain in right shoulder: Secondary | ICD-10-CM | POA: Insufficient documentation

## 2024-02-10 DIAGNOSIS — E1169 Type 2 diabetes mellitus with other specified complication: Secondary | ICD-10-CM

## 2024-02-10 DIAGNOSIS — E66811 Obesity, class 1: Secondary | ICD-10-CM | POA: Diagnosis not present

## 2024-02-10 DIAGNOSIS — E785 Hyperlipidemia, unspecified: Secondary | ICD-10-CM

## 2024-02-10 LAB — MICROALBUMIN, URINE WAIVED
Creatinine, Urine Waived: 200 mg/dL (ref 10–300)
Microalb, Ur Waived: 30 mg/L — ABNORMAL HIGH (ref 0–19)
Microalb/Creat Ratio: <30 mg/g

## 2024-02-10 MED ORDER — ROSUVASTATIN CALCIUM 5 MG PO TABS: 5.0000 mg | ORAL_TABLET | Freq: Every day | ORAL | 1 refills | Status: AC

## 2024-02-10 MED ORDER — METFORMIN HCL 500 MG PO TABS: 500.0000 mg | ORAL_TABLET | Freq: Two times a day (BID) | ORAL | 1 refills | Status: AC

## 2024-02-10 MED ORDER — AMLODIPINE BESYLATE 5 MG PO TABS: 5.0000 mg | ORAL_TABLET | Freq: Every day | ORAL | 1 refills | Status: AC

## 2024-02-10 MED ORDER — LISINOPRIL 40 MG PO TABS: 40.0000 mg | ORAL_TABLET | Freq: Every day | ORAL | 1 refills | Status: AC

## 2024-02-10 NOTE — Assessment & Plan Note (Signed)
 Chronic. Controlled.  Last A1c was 6.6%.  On Metformin  1000mg  BID. Eye exam up to date.  Microalbumin updated today.  Labs ordered today. Will make recommendations based on lab results.  Follow up in 6 months.  Call sooner if concerns arise.

## 2024-02-10 NOTE — Assessment & Plan Note (Signed)
 Chronic.  Controlled.  Continue with current medication regimen of Lisinopril  40mg  and Amlodipine  5mg .   Checks blood pressures at home and runs 120-130/60-70.  Refills sent today.  Return to clinic in 6 months for reevaluation.  Call sooner if concerns arise.

## 2024-02-10 NOTE — Progress Notes (Signed)
 BP 129/63 (BP Location: Right Arm, Cuff Size: Normal)   Pulse (!) 57   Temp 98 F (36.7 C) (Oral)   Ht 5' 0.2 (1.529 m)   Wt 172 lb 6.4 oz (78.2 kg)   SpO2 96%   BMI 33.45 kg/m    Subjective:    Patient ID: Marie Curtis, female    DOB: Feb 15, 1956, 68 y.o.   MRN: [id number]  HPI: Marie Curtis is a 68 y.o. female  Chief Complaint  Patient presents with   Diabetes   Hyperlipidemia   Hypertension   Shoulder Pain    Patient states she has been having R shoulder pain that runs down into her bicep area of her arm. States she has felt this for about a month now and feels more pain as she moves her arm.    HYPERTENSION / HYPERLIPIDEMIA Satisfied with current treatment? yes Duration of hypertension: years BP monitoring frequency: daily BP range: <120/80 BP medication side effects: no Past BP meds: lisinopril  Duration of hyperlipidemia: years Cholesterol medication side effects: no Cholesterol supplements: none Past cholesterol medications: rosuvastatin  (crestor ) Medication compliance: excellent compliance Aspirin: no Recent stressors: no Recurrent headaches: no Visual changes: no Palpitations: no Dyspnea: no Chest pain: no Lower extremity edema: no Dizzy/lightheaded: no  DIABETES Hypoglycemic episodes:no Polydipsia/polyuria: no Visual disturbance: no Chest pain: no Paresthesias: no Glucose Monitoring: yes  Accucheck frequency: Daily  Fasting glucose: 105-115  Post prandial:  Evening:  Before meals: Taking Insulin ?: no  Long acting insulin :  Short acting insulin : Blood Pressure Monitoring: not checking Retinal Examination: need to request records Foot Exam: Up to Date Diabetic Education: Not Completed Pneumovax: Up to Date Influenza: Up to Date Aspirin: no  SHOULDER PAIN Duration: 1 month Involved shoulder: right Mechanism of injury: unknown Location: anterior Onset:gradual Severity: 5/10  Quality:  aching Frequency:  intermittent Radiation: no Aggravating factors: movement  Alleviating factors: nothing  Status: worse Treatments attempted: none  Relief with NSAIDs?:  no Weakness: no Numbness: no Decreased grip strength: no Redness: no Swelling: no Bruising: no Fevers: no    Relevant past medical, surgical, family and social history reviewed and updated as indicated. Interim medical history since our last visit reviewed. Allergies and medications reviewed and updated.  Review of Systems  Eyes:  Negative for visual disturbance.  Respiratory:  Negative for cough, chest tightness and shortness of breath.   Cardiovascular:  Negative for chest pain, palpitations and leg swelling.  Endocrine: Negative for polydipsia and polyuria.  Neurological:  Negative for dizziness, numbness and headaches.    Per HPI unless specifically indicated above     Objective:    BP 129/63 (BP Location: Right Arm, Cuff Size: Normal)   Pulse (!) 57   Temp 98 F (36.7 C) (Oral)   Ht 5' 0.2 (1.529 m)   Wt 172 lb 6.4 oz (78.2 kg)   SpO2 96%   BMI 33.45 kg/m   Wt Readings from Last 3 Encounters:  02/10/24 172 lb 6.4 oz (78.2 kg)  08/11/23 173 lb 3.2 oz (78.6 kg)  02/10/23 176 lb 3.2 oz (79.9 kg)    Physical Exam Vitals and nursing note reviewed.  Constitutional:      General: She is not in acute distress.    Appearance: Normal appearance. She is obese. She is not ill-appearing, toxic-appearing or diaphoretic.  HENT:     Head: Normocephalic.     Right Ear: External ear normal.     Left Ear: External ear normal.  Nose: Nose normal.     Mouth/Throat:     Mouth: Mucous membranes are moist.     Pharynx: Oropharynx is clear.  Eyes:     General:        Right eye: No discharge.        Left eye: No discharge.     Extraocular Movements: Extraocular movements intact.     Conjunctiva/sclera: Conjunctivae normal.     Pupils: Pupils are equal, round, and reactive to light.  Cardiovascular:     Rate and  Rhythm: Normal rate and regular rhythm.     Heart sounds: No murmur heard. Pulmonary:     Effort: Pulmonary effort is normal. No respiratory distress.     Breath sounds: Normal breath sounds. No wheezing or rales.  Musculoskeletal:     Cervical back: Normal range of motion and neck supple.  Skin:    General: Skin is warm and dry.     Capillary Refill: Capillary refill takes less than 2 seconds.  Neurological:     General: No focal deficit present.     Mental Status: She is alert and oriented to person, place, and time. Mental status is at baseline.  Psychiatric:        Mood and Affect: Mood normal.        Behavior: Behavior normal.        Thought Content: Thought content normal.        Judgment: Judgment normal.     Results for orders placed or performed in visit on 08/11/23  Microalbumin, Urine Waived   Collection Time: 08/11/23 11:36 AM  Result Value Ref Range   Microalb, Ur Waived 30 (H) 0 - 19 mg/L   Creatinine, Urine Waived 200 10 - 300 mg/dL   Microalb/Creat Ratio <30 <30 mg/g  CBC with Differential/Platelet   Collection Time: 08/11/23 11:37 AM  Result Value Ref Range   WBC 6.8 3.4 - 10.8 x10E3/uL   RBC 4.52 3.77 - 5.28 x10E6/uL   Hemoglobin 13.4 11.1 - 15.9 g/dL   Hematocrit 58.1 65.9 - 46.6 %   MCV 93 79 - 97 fL   MCH 29.6 26.6 - 33.0 pg   MCHC 32.1 31.5 - 35.7 g/dL   RDW 86.0 88.2 - 84.5 %   Platelets 182 150 - 450 x10E3/uL   Neutrophils 59 Not Estab. %   Lymphs 32 Not Estab. %   Monocytes 5 Not Estab. %   Eos 3 Not Estab. %   Basos 1 Not Estab. %   Neutrophils Absolute 4.0 1.4 - 7.0 x10E3/uL   Lymphocytes Absolute 2.2 0.7 - 3.1 x10E3/uL   Monocytes Absolute 0.4 0.1 - 0.9 x10E3/uL   EOS (ABSOLUTE) 0.2 0.0 - 0.4 x10E3/uL   Basophils Absolute 0.1 0.0 - 0.2 x10E3/uL   Immature Granulocytes 0 Not Estab. %   Immature Grans (Abs) 0.0 0.0 - 0.1 x10E3/uL  Comprehensive metabolic panel with GFR   Collection Time: 08/11/23 11:37 AM  Result Value Ref Range    Glucose 101 (H) 70 - 99 mg/dL   BUN 17 8 - 27 mg/dL   Creatinine, Ser 9.12 0.57 - 1.00 mg/dL   eGFR 73 >40 fO/fpw/8.26   BUN/Creatinine Ratio 20 12 - 28   Sodium 139 134 - 144 mmol/L   Potassium 4.3 3.5 - 5.2 mmol/L   Chloride 103 96 - 106 mmol/L   CO2 19 (L) 20 - 29 mmol/L   Calcium  9.1 8.7 - 10.3 mg/dL   Total Protein 7.2 6.0 -  8.5 g/dL   Albumin 4.6 3.9 - 4.9 g/dL   Globulin, Total 2.6 1.5 - 4.5 g/dL   Bilirubin Total 0.5 0.0 - 1.2 mg/dL   Alkaline Phosphatase 94 44 - 121 IU/L   AST 16 0 - 40 IU/L   ALT 12 0 - 32 IU/L  Lipid panel   Collection Time: 08/11/23 11:37 AM  Result Value Ref Range   Cholesterol, Total 150 100 - 199 mg/dL   Triglycerides 870 0 - 149 mg/dL   HDL 59 >60 mg/dL   VLDL Cholesterol Cal 22 5 - 40 mg/dL   LDL Chol Calc (NIH) 69 0 - 99 mg/dL   Chol/HDL Ratio 2.5 0.0 - 4.4 ratio  TSH   Collection Time: 08/11/23 11:37 AM  Result Value Ref Range   TSH 2.320 0.450 - 4.500 uIU/mL  Hemoglobin A1c   Collection Time: 08/11/23 11:37 AM  Result Value Ref Range   Hgb A1c MFr Bld 6.6 (H) 4.8 - 5.6 %   Est. average glucose Bld gHb Est-mCnc 143 mg/dL      Assessment & Plan:   Problem List Items Addressed This Visit       Cardiovascular and Mediastinum   Essential hypertension   Chronic.  Controlled.  Continue with current medication regimen of Lisinopril  40mg  and Amlodipine  5mg .   Checks blood pressures at home and runs 120-130/60-70.  Refills sent today.  Return to clinic in 6 months for reevaluation.  Call sooner if concerns arise.       Relevant Medications   amLODipine  (NORVASC ) 5 MG tablet   lisinopril  (ZESTRIL ) 40 MG tablet   rosuvastatin  (CRESTOR ) 5 MG tablet     Endocrine   Controlled type 2 diabetes mellitus with complication, without long-term current use of insulin  (HCC)   Chronic. Controlled.  Last A1c was 6.6%.  On Metformin  1000mg  BID. Eye exam up to date.  Microalbumin updated today.  Labs ordered today. Will make recommendations based on lab  results.  Follow up in 6 months.  Call sooner if concerns arise.      Relevant Medications   lisinopril  (ZESTRIL ) 40 MG tablet   metFORMIN  (GLUCOPHAGE ) 500 MG tablet   rosuvastatin  (CRESTOR ) 5 MG tablet   Hyperlipidemia associated with type 2 diabetes mellitus (HCC)   Chronic, controlled. Patient is currently taking rosuvastatin  5 mg p.o. daily and appears to be tolerating well Continue current regimen Labs ordered today Follow-up in 6 months or sooner if concerns arise      Relevant Medications   amLODipine  (NORVASC ) 5 MG tablet   lisinopril  (ZESTRIL ) 40 MG tablet   metFORMIN  (GLUCOPHAGE ) 500 MG tablet   rosuvastatin  (CRESTOR ) 5 MG tablet   Other Relevant Orders   Lipid panel   Diabetes mellitus treated with insulin  and oral medication (HCC) - Primary   Chronic. Controlled.  Last A1c was 6.6%.  On Metformin  1000mg  BID. Eye exam up to date.  Microalbumin updated today.  Labs ordered today. Will make recommendations based on lab results.  Follow up in 6 months.  Call sooner if concerns arise.      Relevant Medications   lisinopril  (ZESTRIL ) 40 MG tablet   metFORMIN  (GLUCOPHAGE ) 500 MG tablet   rosuvastatin  (CRESTOR ) 5 MG tablet   Other Relevant Orders   Comprehensive metabolic panel with GFR   Hemoglobin A1c   Microalbumin, Urine Waived     Other   Obesity (BMI 30.0-34.9)   Recommended eating smaller high protein, low fat meals more frequently and exercising  30 mins a day 5 times a week with a goal of 10-15lb weight loss in the next 3 months.       Other Visit Diagnoses       Acute pain of right shoulder       Will obtain xray of right shoulder.  Suspect arthritis. May need referral to Ortho.   Relevant Orders   DG Shoulder Right     Need for influenza vaccination       Relevant Orders   Flu vaccine HIGH DOSE PF(Fluzone Trivalent) (Completed)        Follow up plan: Return in about 6 months (around 08/10/2024) for HTN, HLD, DM2 FU.

## 2024-02-10 NOTE — Assessment & Plan Note (Signed)
 Recommended eating smaller high protein, low fat meals more frequently and exercising 30 mins a day 5 times a week with a goal of 10-15lb weight loss in the next 3 months.

## 2024-02-10 NOTE — Assessment & Plan Note (Signed)
 Chronic, controlled. Patient is currently taking rosuvastatin  5 mg p.o. daily and appears to be tolerating well Continue current regimen Labs ordered today Follow-up in 6 months or sooner if concerns arise

## 2024-02-11 ENCOUNTER — Ambulatory Visit: Payer: Self-pay | Admitting: Nurse Practitioner

## 2024-02-11 DIAGNOSIS — M25511 Pain in right shoulder: Secondary | ICD-10-CM

## 2024-02-11 LAB — HEMOGLOBIN A1C
Est. average glucose Bld gHb Est-mCnc: 146 mg/dL
Hgb A1c MFr Bld: 6.7 % — ABNORMAL HIGH (ref 4.8–5.6)

## 2024-02-11 LAB — COMPREHENSIVE METABOLIC PANEL WITH GFR
ALT: 11 [IU]/L (ref 0–32)
AST: 14 [IU]/L (ref 0–40)
Albumin: 4.6 g/dL (ref 3.9–4.9)
Alkaline Phosphatase: 86 [IU]/L (ref 49–135)
BUN/Creatinine Ratio: 24 (ref 12–28)
BUN: 22 mg/dL (ref 8–27)
Bilirubin Total: 0.5 mg/dL (ref 0.0–1.2)
CO2: 19 mmol/L — ABNORMAL LOW (ref 20–29)
Calcium: 9.5 mg/dL (ref 8.7–10.3)
Chloride: 101 mmol/L (ref 96–106)
Creatinine, Ser: 0.91 mg/dL (ref 0.57–1.00)
Globulin, Total: 2.9 g/dL (ref 1.5–4.5)
Glucose: 110 mg/dL — ABNORMAL HIGH (ref 70–99)
Potassium: 5 mmol/L (ref 3.5–5.2)
Sodium: 138 mmol/L (ref 134–144)
Total Protein: 7.5 g/dL (ref 6.0–8.5)
eGFR: 68 mL/min/{1.73_m2}

## 2024-02-11 LAB — LIPID PANEL
Chol/HDL Ratio: 2.8 ratio (ref 0.0–4.4)
Cholesterol, Total: 165 mg/dL (ref 100–199)
HDL: 58 mg/dL
LDL Chol Calc (NIH): 74 mg/dL (ref 0–99)
Triglycerides: 198 mg/dL — ABNORMAL HIGH (ref 0–149)
VLDL Cholesterol Cal: 33 mg/dL (ref 5–40)

## 2024-02-14 NOTE — Progress Notes (Signed)
 Marie Curtis                                          MRN: [id number]   02/14/2024   The [facility] Quality Team Specialist reviewed this patient medical record for the purposes of chart review for care gap closure. The following were reviewed: abstraction for care gap closure-controlling blood pressure.    [facility] Quality Team

## 2024-02-18 ENCOUNTER — Ambulatory Visit (INDEPENDENT_AMBULATORY_CARE_PROVIDER_SITE_OTHER)

## 2024-02-18 DIAGNOSIS — M7581 Other shoulder lesions, right shoulder: Secondary | ICD-10-CM | POA: Diagnosis not present

## 2024-02-18 DIAGNOSIS — M25511 Pain in right shoulder: Secondary | ICD-10-CM

## 2024-02-18 MED ORDER — MELOXICAM 15 MG PO TABS: 15.0000 mg | ORAL_TABLET | Freq: Every day | ORAL | 0 refills | Status: AC

## 2024-02-18 NOTE — Patient Instructions (Addendum)

## 2024-02-18 NOTE — Progress Notes (Signed)
 Orthopaedic Surgery New Patient Visit   History of Present Illness: The patient is a 68 y.o. right hand dominant female seen in clinic for 1 month history of right shoulder pain. Patient is spanish speaking only, therefore video interpretation was utilized for visit today. Pain located over anterior aspect of arm.  Describes as a pulling/tightness sensation.  Minimal pain at rest.  Pain exacerbated with movement of the shoulder.  Has affected ADLs; notices pain with cooking, specifically chopping vegetables.  Patient also has pain with hand-washing laundry.  Pain has woken her up from sleep.  Has taken over-the-counter Tylenol  with moderate symptom improvement.  Patient denies any previous history of similar pain.  No known precipitating injury/trauma.  No recent change in activity.  Patient denies any previous history of shoulder injury or intervention.  Patient denies radiating pain, arm weakness, arm numbness/tingling.  Patient is right-hand dominant.  Patient is currently retired.  Last saw PCP, Darice Petty NP with [facility], on 02/10/2024. Patient managed by HTN, hyperlipidemia, and DM2. Last A1c 6.7% on 02/10/2024, one week ago. Managed on metformin . No insulin  usage. No associated CKD; last BUN 22, Cr 0.91, eGFR 68. Patient mentioned her right shoulder pain, and xray was ordered, and patient was advised to follow-up with orthopedics.   Patient accompanied to today's appointment by husband, Aloha.  Spanish Video interpretation used Virl #[id number]   Past Medical, Social and Family History: Past Medical History:  Diagnosis Date   Diabetes mellitus without complication (HCC)    Headache(784.0)    Hypertension    Past Surgical History:  Procedure Laterality Date   CHOLECYSTECTOMY  05/06/2012   Procedure: LAPAROSCOPIC CHOLECYSTECTOMY;  Surgeon: Lynda Leos, MD;  Location: [facility] OR;  Service: General;  Laterality: N/A;   NO PAST SURGERIES     No Known  Allergies Current Outpatient Medications on File Prior to Visit  Medication Sig Dispense Refill   Accu-Chek Softclix Lancets lancets 1 each by Other route daily. 100 each 1   amLODipine  (NORVASC ) 5 MG tablet Take 1 tablet (5 mg total) by mouth daily. 90 tablet 1   blood glucose meter kit and supplies KIT Dispense based on patient and insurance preference. Use up to four times daily as directed. 1 each 0   glucose blood (ACCU-CHEK GUIDE TEST) test strip Use as instructed 100 strip 3   lisinopril  (ZESTRIL ) 40 MG tablet Take 1 tablet (40 mg total) by mouth daily. 90 tablet 1   metFORMIN  (GLUCOPHAGE ) 500 MG tablet Take 1 tablet (500 mg total) by mouth 2 (two) times daily with a meal. 180 tablet 1   rosuvastatin  (CRESTOR ) 5 MG tablet Take 1 tablet (5 mg total) by mouth daily. 90 tablet 1   No current facility-administered medications on file prior to visit.   Social History   Tobacco Use   Smoking status: Never   Smokeless tobacco: Never  Vaping Use   Vaping status: Never Used  Substance Use Topics   Alcohol use: No   Drug use: No      I have reviewed past medical, surgical, social and family history, medications and allergies as documented in the EMR.  Review of Systems - A ROS was performed including pertinent positives and negatives as documented in the HPI.     Physical Exam:  General/Constitutional: NAD Vascular: No edema, swelling or tenderness, except as noted in detailed exam Integumentary: No impressive skin lesions present, except as noted in detailed exam Neuro/Psych: Normal mood and affect, oriented  to person, place and time Musculoskeletal: Normal, except as noted in detailed exam and in HPI   Focused Orthopaedic Examination:    Shoulder focused exam:  Right shoulder gross examination normal to inspection. No erythema, edema, ecchymosis, or gross deformity. Patient localizes pain over lateral aspect of pectoralis major muscle and tendinous insertion on the  proximal humerus. Tenderness to palpation about the musculotendinous junction of the pectoralis major muscle. Pec major tendon intact without visible deformity in the axillary/chest region. Mild pain with adduction of the arm against resistance. Mild tenderness at course of long head biceps tendon over anterior shoulder.    RIGHT  Scapula Atrophy Negative   Winging Negative  Rotator cuff Supraspinatus 5/5   Infraspinatus 5/5   Subscapularis 5/5  AROM/PROM (degrees) FF 0-155 / 0-170   Abduction 0-150 / 0-170   ER0 0-50 / 0-60   IR(back) L1 / T10  Palpation (pain): AC negative   Biceps Positive Minimal pain with palpation   Coracoid negative  Special Tests: O'Briens negative   Mayo-shear negative   Cross body Adduction negative   Speeds  negative   Yergasons Negative Minimal pain with elbow flexion against resistance   Jobe's negative   Neer negative   Hawkins positive   Belly Press negative   Hornblower's negative  Other: Sulcus sign negative   Lateral deltoid 5/5    Vascular/Lymphatic: Fingers warm and well perfused with 2+ radial pulse.    Neurologic: Sensation intact to the Median, Ulnar and Radial nerve distribution of the hand. Sensation intact to lateral deltoid (axillary nerve).     XR Right Shoulder Imaging: X-rays of the Right shoulder including 3-views (axial, scapular Y-view, AP) obtained 02/10/2024 at [facility] were reviewed personally by me and with patient.  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 Slight downward sloping acromion. No acute fracture. No dislocation. No soft tissue abnormality.    Radiology Read:  Right Shoulder Xray on 02/10/2024 at [facility] IMPRESSION: Trace acromioclavicular spurring.  Assessment:  Right pectoralis major tendinitis/strain Possible right long head biceps tendinitis  Plan:  Patient was seen and  examined in office today. We reviewed patient's history, examination, and imaging in detail. Based on information available for this encounter, patient with 1 month history of right anterior shoulder pain. Pain is focal over the right pectoralis major muscle/tendon junction with minimal pain over the long head biceps tendon. Negative biceps pathology on provocative testing. No precipitating injury/trauma. Explained the natural course of the injury process and management today.  Discussed conservative treatment of ice/heat, rest, home stretching exercises, formal physical therapy, over-the-counter anti-inflammatories.  Prescribed short course of meloxicam 15 mg qd for 2 weeks (patient with no CKD and recent normal kidney function testing) and referral to formal physical therapy. Advised not to take other NSAIDs while taking Meloxicam. Patient scheduled to return in 6 weeks for re-evaluation, sooner if she develops any new/worsening symptoms.   All questions, concerns and comments were addressed to the best of my ability.  Follow-up: 6 weeks   Arlyss GEANNIE Schneider, DO Orthopedic Surgery & Sports Medicine [place]   This document was dictated using Dragon voice recognition software. A reasonable attempt at proof reading has been made to minimize errors.

## 2024-02-29 ENCOUNTER — Other Ambulatory Visit: Payer: Self-pay | Admitting: Physician Assistant

## 2024-02-29 DIAGNOSIS — E118 Type 2 diabetes mellitus with unspecified complications: Secondary | ICD-10-CM

## 2024-03-02 NOTE — Telephone Encounter (Signed)
 Requested Prescriptions  Pending Prescriptions Disp Refills   Accu-Chek Softclix Lancets lancets [Pharmacy Med Name: ACCU-CHEK SOFTCLIX LANCETS] 100 each 1    Sig: 1 each by Other route daily.     Endocrinology: Diabetes - Testing Supplies Passed - 03/02/2024 12:11 PM      Passed - Valid encounter within last 12 months    Recent Outpatient Visits           3 weeks ago Diabetes mellitus treated with insulin  and oral medication (HCC)   [place] [facility] Melvin Pao, NP   6 months ago Annual physical exam   [place] [facility] Melvin Pao, NP       Future Appointments             In 1 month Gust Molly, DO [place] [facility]

## 2024-04-17 ENCOUNTER — Ambulatory Visit

## 2024-05-25 NOTE — Progress Notes (Signed)
 Marie Curtis                                          MRN: [id number]   05/25/2024   The [facility] Quality Team Specialist reviewed this patient medical record for the purposes of chart review for care gap closure. The following were reviewed: chart review for care gap closure-colorectal cancer screening.    [facility] Quality Team

## 2024-08-10 ENCOUNTER — Ambulatory Visit: Admitting: Nurse Practitioner
